# Patient Record
Sex: Female | Born: 1970 | Race: Black or African American | Hispanic: No | Marital: Single | Smoking: Never smoker
Health system: Southern US, Community
[De-identification: ages and names within clinical notes are randomized; demographics above are authoritative.]

## PROBLEM LIST (undated history)

## (undated) DIAGNOSIS — B009 Herpesviral infection, unspecified: Secondary | ICD-10-CM

## (undated) DIAGNOSIS — G459 Transient cerebral ischemic attack, unspecified: Secondary | ICD-10-CM

## (undated) DIAGNOSIS — D649 Anemia, unspecified: Secondary | ICD-10-CM

## (undated) HISTORY — PX: ABDOMINAL HYSTERECTOMY: SHX81

## (undated) HISTORY — DX: Transient cerebral ischemic attack, unspecified: G45.9

## (undated) HISTORY — DX: Herpesviral infection, unspecified: B00.9

---

## 2003-01-09 HISTORY — PX: MYOMECTOMY: SHX85

## 2007-10-12 ENCOUNTER — Emergency Department (HOSPITAL_COMMUNITY): Admission: EM | Admit: 2007-10-12 | Discharge: 2007-10-12 | Payer: Self-pay | Admitting: Emergency Medicine

## 2011-04-19 ENCOUNTER — Ambulatory Visit (INDEPENDENT_AMBULATORY_CARE_PROVIDER_SITE_OTHER): Payer: Self-pay | Admitting: Physician Assistant

## 2011-04-19 ENCOUNTER — Encounter: Payer: Self-pay | Admitting: Physician Assistant

## 2011-04-19 VITALS — BP 124/78 | HR 69 | Temp 98.5°F | Ht 62.0 in | Wt 146.0 lb

## 2011-04-19 DIAGNOSIS — N920 Excessive and frequent menstruation with regular cycle: Secondary | ICD-10-CM

## 2011-04-19 DIAGNOSIS — N938 Other specified abnormal uterine and vaginal bleeding: Secondary | ICD-10-CM | POA: Insufficient documentation

## 2011-04-19 NOTE — Progress Notes (Signed)
Spotted on 04/08/11 for 3 days then normal period 04/11/11 continued till today she is spotting. "She states that spotting after period usually lasts for 3-4days.

## 2011-04-19 NOTE — Progress Notes (Signed)
Chief Complaint:  Menstrual Problem   Mary Armstrong is  41 y.o. G3P0030.  Patient's last menstrual period was 04/08/2011..  She presents complaining of Menstrual Problem  Onset is described as ongoing and has been present for  6 months. Pt had myomectomy for fibroids with pain 6 years ago. Reports regular, normal periods until November of 2012 when they started to become heavier. No period in December, heavy in January   Obstetrical/Gynecological History: Pertinent Gynecological History: Menses: flow is excessive with use of 12 pads or tampons on heaviest days Bleeding: heavy Contraception: none Sexually transmitted diseases: HSV Previous GYN Procedures: myomectomy  Last mammogram:N/A Last pap: normal Date: 2012   Past Medical History: Past Medical History  Diagnosis Date  . Menorrhagia 04/19/2011    Past Surgical History: Past Surgical History  Procedure Date  . Myomectomy 2005    Family History: Family History  Problem Relation Age of Onset  . Hypertension Mother     Social History: History  Substance Use Topics  . Smoking status: Passive Smoker    Types: Cigarettes  . Smokeless tobacco: Never Used  . Alcohol Use: Yes     social use    Allergies: No Known Allergies  Review of Systems - Negative except what has been reviewed in the HPI  Physical Exam   Blood pressure 124/78, pulse 69, temperature 98.5 F (36.9 C), temperature source Oral, height 5\' 2"  (1.575 m), weight 146 lb (66.225 kg), last menstrual period 04/08/2011.  General: General appearance - alert, well appearing, and in no distress, oriented to person, place, and time and normal appearing weight Mental status - alert, oriented to person, place, and time, normal mood, behavior, speech, dress, motor activity, and thought processes, affect appropriate to mood Focused Gynecological Exam: VULVA: normal appearing vulva with no masses, tenderness or lesions, VAGINA: vaginal discharge - bloody and small  amount, CERVIX: normal appearing cervix without discharge or lesions, UTERUS: enlarged to 14 week's size, irregular, mobile, ADNEXA: normal adnexa in size, nontender and no masses  Assessment: 1. Menorrhagia  US Pelvis Complete, US Transvaginal Non-OB   Probable Fibroid uterus  Plan: Given pt desires for fertility, will have pt FU in 2-3 weeks for result and plan of care with MD.   Maylon Cos E. 04/19/2011,3:58 PM

## 2011-04-19 NOTE — Patient Instructions (Signed)
Menorrhagia Dysfunctional uterine bleeding is different from a normal menstrual period. When periods are heavy or there is more bleeding than is usual for you, it is called menorrhagia. It may be caused by hormonal imbalance, or physical, metabolic, or other problems. Examination is necessary in order that your caregiver may treat treatable causes. If this is a continuing problem, a D&C may be needed. That means that the cervix (the opening of the uterus or womb) is dilated (stretched larger) and the lining of the uterus is scraped out. The tissue scraped out is then examined under a microscope by a specialist (pathologist) to make sure there is nothing of concern that needs further or more extensive treatment. HOME CARE INSTRUCTIONS   If medications were prescribed, take exactly as directed. Do not change or switch medications without consulting your caregiver.   Long term heavy bleeding may result in iron deficiency. Your caregiver may have prescribed iron pills. They help replace the iron your body lost from heavy bleeding. Take exactly as directed. Iron may cause constipation. If this becomes a problem, increase the bran, fruits, and roughage in your diet.   Do not take aspirin or medicines that contain aspirin one week before or during your menstrual period. Aspirin may make the bleeding worse.   If you need to change your sanitary pad or tampon more than once every 2 hours, stay in bed and rest as much as possible until the bleeding stops.   Eat well-balanced meals. Eat foods high in iron. Examples are leafy green vegetables, meat, liver, eggs, and whole grain breads and cereals. Do not try to lose weight until the abnormal bleeding has stopped and your blood iron level is back to normal.  SEEK MEDICAL CARE IF:   You need to change your sanitary pad or tampon more than once an hour.   You develop nausea (feeling sick to your stomach) and vomiting, dizziness, or diarrhea while you are taking  your medicine.   You have any problems that may be related to the medicine you are taking.  SEEK IMMEDIATE MEDICAL CARE IF:   You have a fever.   You develop chills.   You develop severe bleeding or start to pass blood clots.   You feel dizzy or faint.  MAKE SURE YOU:   Understand these instructions.   Will watch your condition.   Will get help right away if you are not doing well or get worse.  Document Released: 12/25/2004 Document Revised: 12/14/2010 Document Reviewed: 08/15/2007 Exit[employer] Patient Information 2012 [employer].Fibroids You have been diagnosed as having a fibroid. Fibroids are smooth muscle lumps (tumors) which can occur any place in a woman's body. They are usually in the womb (uterus). The most common problem (symptom) of fibroids is bleeding. Over time this may cause low red blood cells (anemia). Other symptoms include feelings of pressure and pain in the pelvis. The diagnosis (learning what is wrong) of fibroids is made by physical exam. Sometimes tests such as an ultrasound are used. This is helpful when fibroids are felt around the ovaries and to look for tumors. TREATMENT   Most fibroids do not need surgical or medical treatment. Sometimes a tissue sample (biopsy) of the lining of the uterus is done to rule out cancer. If there is no cancer and only a small amount of bleeding, the problem can be watched.   Hormonal treatment can improve the problem.   When surgery is needed, it can consist of removing the fibroid. Vaginal   birth may not be possible after the removal of fibroids. This depends on where they are and the extent of surgery. When pregnancy occurs with fibroids it is usually normal.   Your caregiver can help decide which treatments are best for you.  HOME CARE INSTRUCTIONS   Do not use aspirin as this may increase bleeding problems.   If your periods (menses) are heavy, record the number of pads or tampons used per month. Bring this information  to your caregiver. This can help them determine the best treatment for you.  SEEK IMMEDIATE MEDICAL CARE IF:  You have pelvic pain or cramps not controlled with medications, or experience a sudden increase in pain.   You have an increase of pelvic bleeding between and during menses.   You feel lightheaded or have fainting spells.   You develop worsening belly (abdominal) pain.  Document Released: 12/23/1999 Document Revised: 12/14/2010 Document Reviewed: 08/14/2007 Exit[employer] Patient Information 2012 [employer]. 

## 2011-04-23 ENCOUNTER — Telehealth: Payer: Self-pay | Admitting: *Deleted

## 2011-04-23 NOTE — Telephone Encounter (Signed)
Returned call to pt (twice) and was told I had the wrong #. I verified that the # I dialed was [phone]. I then called her alternate contact: Andria Frames- (mother) and spoke with a gentleman who stated that neither Marylene Land or Alvira Philips were @ home. I asked him to have Twisha call us back. He agreed.   *Note: pt needs to be scheduled for pelvic and transvag US which was ordered on 4/11.

## 2011-04-23 NOTE — Telephone Encounter (Signed)
Patient states was seen on Friday and is still having same problem. Was told if having same problem to call back.

## 2011-04-24 NOTE — Telephone Encounter (Signed)
Called and spoke w/pt. She stated that she is still having vaginal bleeding and Rosalita Chessman had told her to call back.  She is using slender tampons and changing them approximately 2x per Severino Paolo.  I told pt that this may be abnormal for her but is not excessive and does not require anything else to be done immediately.  I advised her to go to [facility] for bleeding which is 1 or more pads/tampons/hr x3 consecutive hours.  I explained that she also needs [place] prior to her next clinic appt. I scheduled her [place] for 05/03/11 @ 1030.  Pt was satisfied with the plan of care and voiced understanding of all instructions and information.

## 2011-04-24 NOTE — Telephone Encounter (Signed)
Pt called front desk and gave correct ph # as [phone].

## 2011-05-03 ENCOUNTER — Ambulatory Visit (HOSPITAL_COMMUNITY)
Admission: RE | Admit: 2011-05-03 | Discharge: 2011-05-03 | Disposition: A | Discharge status: Home / Self Care | Payer: Self-pay | Source: Ambulatory Visit | Attending: Physician Assistant | Admitting: Physician Assistant

## 2011-05-03 DIAGNOSIS — D251 Intramural leiomyoma of uterus: Secondary | ICD-10-CM | POA: Insufficient documentation

## 2011-05-03 DIAGNOSIS — N920 Excessive and frequent menstruation with regular cycle: Secondary | ICD-10-CM | POA: Insufficient documentation

## 2011-05-11 ENCOUNTER — Encounter: Payer: Self-pay | Admitting: Obstetrics & Gynecology

## 2011-05-11 ENCOUNTER — Ambulatory Visit (INDEPENDENT_AMBULATORY_CARE_PROVIDER_SITE_OTHER): Payer: Self-pay | Admitting: Obstetrics & Gynecology

## 2011-05-11 ENCOUNTER — Other Ambulatory Visit (HOSPITAL_COMMUNITY)
Admission: RE | Admit: 2011-05-11 | Discharge: 2011-05-11 | Disposition: A | Discharge status: Home / Self Care | Payer: Self-pay | Source: Ambulatory Visit | Attending: Obstetrics & Gynecology | Admitting: Obstetrics & Gynecology

## 2011-05-11 VITALS — BP 125/81 | HR 66 | Temp 98.0°F | Ht 62.0 in | Wt 144.3 lb

## 2011-05-11 DIAGNOSIS — D259 Leiomyoma of uterus, unspecified: Secondary | ICD-10-CM

## 2011-05-11 DIAGNOSIS — D219 Benign neoplasm of connective and other soft tissue, unspecified: Secondary | ICD-10-CM | POA: Insufficient documentation

## 2011-05-11 DIAGNOSIS — N920 Excessive and frequent menstruation with regular cycle: Secondary | ICD-10-CM

## 2011-05-11 DIAGNOSIS — Z1231 Encounter for screening mammogram for malignant neoplasm of breast: Secondary | ICD-10-CM

## 2011-05-11 DIAGNOSIS — Z0000 Encounter for general adult medical examination without abnormal findings: Secondary | ICD-10-CM

## 2011-05-11 LAB — CBC
MCH: 31.6 pg (ref 26.0–34.0)
MCHC: 33.4 g/dL (ref 30.0–36.0)
Platelets: 333 10*3/uL (ref 150–400)

## 2011-05-11 LAB — POCT PREGNANCY, URINE: Preg Test, Ur: NEGATIVE

## 2011-05-11 LAB — TSH: TSH: 0.381 u[IU]/mL (ref 0.350–4.500)

## 2011-05-11 NOTE — Assessment & Plan Note (Signed)
A: recurrent symptomatic uterine fibroids.  P: Pap smear performed today. CBC and TSH checked. Mammogram ordered Patient refer to Reproductive Endocrinology Dr. Lajuana Carry, MD  F/u as needed based on results of pap smear, endometrial biopsy and desire for surgical treatment of fibroids (myomectomy vs. Uterine artery embolization).

## 2011-05-11 NOTE — Patient Instructions (Addendum)
Charliegh,  Thank you very much for coming in to see [place] today. Please take motrin as needed for pain. We will be in contact with the results of your pap smear and endometrial biopsy and labs.  Please have your mammogram done.  Pleas contact the Reproductive Endocrinologist: Dr. Lajuana Carry, MD  [address] [id number]  [phone]   Drs. Avaleigh Decuir and  [employer]

## 2011-05-11 NOTE — Progress Notes (Signed)
Patient ID: Mary Armstrong, female   DOB: 1970-05-24, 41 y.o.   MRN: [id number] Subjective:    Mary Armstrong is a 41 y.o. female who presents with uterine fibroids.  Since her last office visit she had uterine ultrasound that revealed multiple uterine firebirds, the largest of which has a submucosal component. Since the last visit she has also had persistent uterine bleeding. Flow was initially heavy but she is now spotting. Dysmenorrhea: none. Cyclic symptoms include bloating and intermittent R sided pelvic pain. . No intermenstrual bleeding, spotting, or discharge.  Of note, patient has never carried a fetus to term. She desires children. She has actively been trying to conceive for the last year without success.   Current contraception: none History of abnormal Pap smear: no Family history of uterine or ovarian cancer: no Regular self breast exam: no History of abnormal mammogram: no previous mammogram Family history of breast cancer: no History of abnormal lipids: no Menstrual History: OB History    Grav Para Term Preterm Abortions TAB SAB Ect Mult Living   4 0   4 1 3    0      Patient's last menstrual period was 04/08/2011.   Review of Systems Pertinent items are noted in HPI.    Objective:     BP 125/81  Pulse 66  Temp(Src) 98 F (36.7 C) (Oral)  Ht 5\' 2"  (1.575 m)  Wt 144 lb 4.8 oz (65.454 kg)  BMI 26.39 kg/m2  LMP 04/08/2011 General appearance: alert, cooperative and no distress Pelvic: cervix normal in appearance, external genitalia normal, no adnexal masses or tenderness, no cervical motion tenderness, rectovaginal septum normal and uterus enlarged. Dark brown blood in vagina.    Endometrial Biopsy Procedure Note  Pre-operative Diagnosis: uterine fibroids and abnormal uterine bleeding   Post-operative Diagnosis: same  Indications: abnormal uterine bleeding  Procedure Details   Urine pregnancy test was done and result was negative.  The risks (including  infection, bleeding, pain, and uterine perforation) and benefits of the procedure were explained to the patient and Verbal informed consent was obtained.  Antibiotic prophylaxis against endocarditis was not indicated.   The patient was placed in the dorsal lithotomy position.  A Graves' speculum inserted in the vagina, and the cervix prepped with povidone iodine.  Endocervical curettage with a Kevorkian curette was not performed.   A sharp tenaculum was applied to the anterior lip of the cervix for stabilization.  A sterile uterine sound was used to sound the uterus to a depth of 9.5cm.  A Pipelle endometrial aspirator was used to sample the endometrium.  Sample was sent for pathologic examination.  Condition: Stable  Complications: None  Plan:  The patient was advised to call for any fever or for prolonged or severe pain or bleeding. She was advised to use NSAID as needed for mild to moderate pain. She was advised to avoid vaginal intercourse for 48 hours or until the bleeding has completely stopped.  Attending Physician Documentation:  Dr. Nicholaus Bloom attending and present for assistance.  Assessment:    Symptomatic uterine fibroids.    Plan:  Pap smear performed today. CBC and TSH checked. Mammogram ordered Patient refer to Reproductive Endocrinology Dr. Lajuana Carry, MD  F/u as needed based on results of pap smear, endometrial biopsy and desire for surgical treatment of fibroids (myomectomy vs. Uterine artery embolization).

## 2011-05-25 ENCOUNTER — Ambulatory Visit: Payer: Self-pay | Admitting: Advanced Practice Midwife

## 2011-05-28 ENCOUNTER — Ambulatory Visit (HOSPITAL_COMMUNITY): Admission: RE | Admit: 2011-05-28 | Payer: Self-pay | Source: Ambulatory Visit

## 2011-05-29 ENCOUNTER — Other Ambulatory Visit: Payer: Self-pay | Admitting: Obstetrics & Gynecology

## 2011-05-29 DIAGNOSIS — Z1231 Encounter for screening mammogram for malignant neoplasm of breast: Secondary | ICD-10-CM

## 2011-06-07 ENCOUNTER — Encounter: Payer: Self-pay | Admitting: Obstetrics & Gynecology

## 2011-06-07 ENCOUNTER — Ambulatory Visit (INDEPENDENT_AMBULATORY_CARE_PROVIDER_SITE_OTHER): Payer: Self-pay | Admitting: Obstetrics & Gynecology

## 2011-06-07 VITALS — BP 122/87 | HR 72 | Temp 98.0°F | Ht 62.0 in | Wt 148.1 lb

## 2011-06-07 DIAGNOSIS — N949 Unspecified condition associated with female genital organs and menstrual cycle: Secondary | ICD-10-CM

## 2011-06-07 DIAGNOSIS — D259 Leiomyoma of uterus, unspecified: Secondary | ICD-10-CM

## 2011-06-07 DIAGNOSIS — N938 Other specified abnormal uterine and vaginal bleeding: Secondary | ICD-10-CM

## 2011-06-07 DIAGNOSIS — D219 Benign neoplasm of connective and other soft tissue, unspecified: Secondary | ICD-10-CM

## 2011-06-07 DIAGNOSIS — N979 Female infertility, unspecified: Secondary | ICD-10-CM

## 2011-06-07 NOTE — Patient Instructions (Signed)
Return to clinic for any scheduled appointments or for any gynecologic concerns as needed.   

## 2011-06-07 NOTE — Progress Notes (Signed)
History:  41 y.o. G4P0040 here today for discussion about management of DUB and fibroids.  She had a negative endometrial biopsy and ultrasound showing fibroids, see report below.  She reports daily spotting, no heavy bleeding.  Spotting is  "annoying". No other associated symptoms.  Patient is interested in management strategy that will not hinder fertility; she wants to try to get pregnant this year.  Also wants to be referred to an infertility specialist.  The following portions of the patient's history were reviewed and updated as appropriate: allergies, current medications, past family history, past medical history, past social history, past surgical history and problem list.  Review of Systems:  Pertinent items are noted in HPI.  Objective:  Physical Exam Blood pressure 122/87, pulse 72, temperature 98 F (36.7 C), temperature source Oral, height 5\' 2"  (1.575 m), weight 148 lb 1.6 oz (67.178 kg). [facility]  Labs and Imaging POCT PREGNANCY, URINE   Collection Time   05/11/11  9:22 AM      Component Value Range   Preg Test, Ur NEGATIVE  NEGATIVE   TSH   Collection Time   05/11/11  9:36 AM      Component Value Range   TSH 0.381  0.350 - 4.500 (uIU/mL)  CBC   Collection Time   05/11/11  9:36 AM      Component Value Range   WBC 5.2  4.0 - 10.5 (K/uL)   RBC 3.99  3.87 - 5.11 (MIL/uL)   Hemoglobin 12.6  12.0 - 15.0 (g/dL)   HCT 16.1  09.6 - 04.5 (%)   MCV 94.5  78.0 - 100.0 (fL)   MCH 31.6  26.0 - 34.0 (pg)   MCHC 33.4  30.0 - 36.0 (g/dL)   RDW 40.9  81.1 - 91.4 (%)   Platelets 333  150 - 400 (K/uL)   05/11/11 Pap normal   Endometrium, biopsy: INACTIVE ENDOMETRIUM, ABUNDANT BLOOD AND SCANT BENIGN ENDOCERVIX. NO HYPERPLASIA OR CARCINOMA. 04/19/11 TRANSABDOMINAL AND TRANSVAGINAL ULTRASOUND OF PELVIS  Findings: Uterus: Is anteverted and retroflexed and demonstrates a sagittal length of 11.1 cm, depth of 6.9 cm and width of 6.7 cm. Multiple fibroids are identified with the largest located  in the posterior left upper uterine segment measuring 4.5 x 5.2 x 4.3 cm. This deviates the endometrial canal towards the right suggesting a small submucosal component. A second fibroid is mural in the left lateral mid body measuring 1.5 x 1.6 x 1.9 cm. Endometrium: Is homogeneously echogenic with an AP width of 8 mm.  No areas of focal thickening or heterogeneity are seen. The appearance correlates with a presecretory endometrial stripe and corresponds with the provided LMP of 04/09/2011. Right ovary: Has a normal appearance measuring 3.0 x 1.7 x 1.7 cm  Left ovary: Has a normal appearance measuring 3.2 x 3.1 x 2.5 cm Other findings: A small amount of simple free fluid is noted adjacent to the right ovary. No separate adnexal masses are seen.  IMPRESSION: Fibroid uterus with fibroid sizes and locations as noted above. The largest fibroid appears to have a small submucosal component. No focal endometrial abnormality noted. Normal ovaries.    Assessment & Plan:  Discussed management options for abnormal uterine bleeding including oral Provera, Depo Provera, Mirena IUD, D&C, hysteroscopic myomectomy (even though only small submucosal component seen on ultrasound), endometrial ablation (Novasure/Hydrothermal Ablation/Thermachoice) or hysterectomy as definitive surgical management.  Discussed risks and benefits of each method.  Patient does not want any of these modalities; wants to talk to  an infertility specialist first.  She was referred to [facility] in [facility] for further discussion and management.  Total visit time was about 25 minutes.

## 2011-06-19 ENCOUNTER — Ambulatory Visit (HOSPITAL_COMMUNITY): Payer: Self-pay | Attending: Obstetrics & Gynecology

## 2011-07-05 ENCOUNTER — Ambulatory Visit: Payer: Self-pay | Admitting: Obstetrics & Gynecology

## 2012-08-08 ENCOUNTER — Emergency Department (HOSPITAL_BASED_OUTPATIENT_CLINIC_OR_DEPARTMENT_OTHER)
Admission: EM | Admit: 2012-08-08 | Discharge: 2012-08-08 | Disposition: A | Discharge status: Home / Self Care | Payer: BC Managed Care – PPO | Attending: Emergency Medicine | Admitting: Emergency Medicine

## 2012-08-08 ENCOUNTER — Encounter (HOSPITAL_BASED_OUTPATIENT_CLINIC_OR_DEPARTMENT_OTHER): Payer: Self-pay | Admitting: *Deleted

## 2012-08-08 DIAGNOSIS — Z87828 Personal history of other (healed) physical injury and trauma: Secondary | ICD-10-CM | POA: Insufficient documentation

## 2012-08-08 DIAGNOSIS — Z8742 Personal history of other diseases of the female genital tract: Secondary | ICD-10-CM | POA: Insufficient documentation

## 2012-08-08 DIAGNOSIS — Y9389 Activity, other specified: Secondary | ICD-10-CM | POA: Insufficient documentation

## 2012-08-08 DIAGNOSIS — W2209XA Striking against other stationary object, initial encounter: Secondary | ICD-10-CM | POA: Insufficient documentation

## 2012-08-08 DIAGNOSIS — Y990 Civilian activity done for income or pay: Secondary | ICD-10-CM | POA: Insufficient documentation

## 2012-08-08 DIAGNOSIS — M79601 Pain in right arm: Secondary | ICD-10-CM

## 2012-08-08 DIAGNOSIS — S59909A Unspecified injury of unspecified elbow, initial encounter: Secondary | ICD-10-CM | POA: Insufficient documentation

## 2012-08-08 DIAGNOSIS — M7521 Bicipital tendinitis, right shoulder: Secondary | ICD-10-CM | POA: Diagnosis present

## 2012-08-08 DIAGNOSIS — S6990XA Unspecified injury of unspecified wrist, hand and finger(s), initial encounter: Secondary | ICD-10-CM | POA: Insufficient documentation

## 2012-08-08 DIAGNOSIS — Y9289 Other specified places as the place of occurrence of the external cause: Secondary | ICD-10-CM | POA: Insufficient documentation

## 2012-08-08 DIAGNOSIS — M7520 Bicipital tendinitis, unspecified shoulder: Secondary | ICD-10-CM | POA: Insufficient documentation

## 2012-08-08 MED ORDER — OXYCODONE-ACETAMINOPHEN 5-325 MG PO TABS: 2.0000 | ORAL_TABLET | Freq: Four times a day (QID) | ORAL | Status: DC | PRN

## 2012-08-08 NOTE — ED Provider Notes (Signed)
CSN: [id number]     Arrival date & time 08/08/12  1413 History     First MD Initiated Contact with Patient 08/08/12 1504     Chief Complaint  Patient presents with  . Arm Pain   (Consider location/radiation/quality/duration/timing/severity/associated sxs/prior Treatment) Patient is a 42 y.o. female presenting with arm pain. The history is provided by the patient.  Arm Pain This is a new problem. Episode onset: 1 week ago. The problem occurs constantly. The problem has not changed since onset.Pertinent negatives include no chest pain, no abdominal pain, no headaches and no shortness of breath. Exacerbated by: flexion/extension of right arm. Nothing relieves the symptoms. She has tried acetaminophen for the symptoms. The treatment provided mild relief.    Past Medical History  Diagnosis Date  . Menorrhagia 04/19/2011   Past Surgical History  Procedure Laterality Date  . Myomectomy  2005   Family History  Problem Relation Age of Onset  . Hypertension Mother   . Thyroid disease Mother    History  Substance Use Topics  . Smoking status: Passive Smoke Exposure - Never Smoker    Types: Cigarettes  . Smokeless tobacco: Never Used  . Alcohol Use: Yes     Comment: social use   OB History   Grav Para Term Preterm Abortions TAB SAB Ect Mult Living   4 0   4 1 3    0     Review of Systems  Constitutional: Negative for fever and fatigue.  HENT: Negative for congestion, drooling and neck pain.   Eyes: Negative for pain.  Respiratory: Negative for cough and shortness of breath.   Cardiovascular: Negative for chest pain.  Gastrointestinal: Negative for nausea, vomiting, abdominal pain and diarrhea.  Genitourinary: Negative for dysuria and hematuria.  Musculoskeletal: Negative for back pain and gait problem.  Skin: Negative for color change.  Neurological: Negative for dizziness and headaches.  Hematological: Negative for adenopathy.  Psychiatric/Behavioral: Negative for behavioral  problems.  All other systems reviewed and are negative.    Allergies  Review of patient's allergies indicates no known allergies.  Home Medications   Current Outpatient Rx  Name  Route  Sig  Dispense  Refill  . Lysine 1000 MG TABS   Oral   Take by mouth.         . oxyCODONE-acetaminophen (PERCOCET) 5-325 MG per tablet   Oral   Take 2 tablets by mouth every 6 (six) hours as needed for pain.   15 tablet   0    BP 135/98  Pulse 55  Temp(Src) 98.3 F (36.8 C) (Oral)  Resp 20  Wt 132 lb (59.875 kg)  BMI 24.14 kg/m2  SpO2 100% Physical Exam  Nursing note and vitals reviewed. Constitutional: She is oriented to person, place, and time. She appears well-developed and well-nourished.  HENT:  Head: Normocephalic.  Mouth/Throat: No oropharyngeal exudate.  Eyes: Conjunctivae and EOM are normal. Pupils are equal, round, and reactive to light.  Neck: Normal range of motion. Neck supple.  Cardiovascular: Normal rate, regular rhythm, normal heart sounds and intact distal pulses.  Exam reveals no gallop and no friction rub.   No murmur heard. Pulmonary/Chest: Effort normal and breath sounds normal. No respiratory distress. She has no wheezes.  Abdominal: Soft. Bowel sounds are normal. There is no tenderness. There is no rebound and no guarding.  Musculoskeletal: Normal range of motion. She exhibits no edema and no tenderness.  Normal rom of RUE. 2+ pulses in bilateral UE's. Normal sensation throughout.  Reproduction of pain w/ active flex/ext of elbow, but not w/ passive. Mild ttp of right distal bicep insertion and right proximal anterior surface of forearm.   Neurological: She is alert and oriented to person, place, and time.  Skin: Skin is warm and dry.  Psychiatric: She has a normal mood and affect. Her behavior is normal.    ED Course   Procedures (including critical care time)  Labs Reviewed - No data to display No results found. 1. Right arm pain   2. Biceps tendinitis  on right     MDM  4:00 PM 42 y.o. female pw right arm pain that began approx 1 week ago. Pt hit right elbow while working at that time, notes proximal forearm and distal bicep pain w/ movement since. Neurovasc intact. Ddx includes contusion, bicipital tendonitis, ulnar nerve contusion. Pt notes nsaids only provided mild relief. Will give short course of percocet and recommend several days of rest as pt works at [employer] w/ repeated use of her arms/hands.  4:00 PM:  I have discussed the diagnosis/risks/treatment options with the patient and believe the pt to be eligible for discharge home to follow-up and establish with a pcp if pain continues. We also discussed returning to the ED immediately if new or worsening sx occur. We discussed the sx which are most concerning (e.g., worsening pain, numbness, weakness) that necessitate immediate return. Any new prescriptions provided to the patient are listed below.  Discharge Medication List as of 08/08/2012  3:39 PM    START taking these medications   Details  oxyCODONE-acetaminophen (PERCOCET) 5-325 MG per tablet Take 2 tablets by mouth every 6 (six) hours as needed for pain., Starting 08/08/2012, Until Discontinued, Print          Junius Argyle, MD 08/08/12 1600

## 2012-08-08 NOTE — ED Notes (Signed)
3 weeks ago she had a mash injury to her right thumb. Right elbow injury hit against metal surface last week. Woke this am with numbness and sharp shooting pain in her right arm. Hard to grip objects.

## 2012-08-11 ENCOUNTER — Encounter (HOSPITAL_BASED_OUTPATIENT_CLINIC_OR_DEPARTMENT_OTHER): Payer: Self-pay | Admitting: Family Medicine

## 2012-08-11 ENCOUNTER — Emergency Department (HOSPITAL_BASED_OUTPATIENT_CLINIC_OR_DEPARTMENT_OTHER)
Admission: EM | Admit: 2012-08-11 | Discharge: 2012-08-11 | Disposition: A | Discharge status: Home / Self Care | Payer: BC Managed Care – PPO | Attending: Emergency Medicine | Admitting: Emergency Medicine

## 2012-08-11 DIAGNOSIS — M79609 Pain in unspecified limb: Secondary | ICD-10-CM | POA: Insufficient documentation

## 2012-08-11 DIAGNOSIS — M778 Other enthesopathies, not elsewhere classified: Secondary | ICD-10-CM

## 2012-08-11 DIAGNOSIS — M79601 Pain in right arm: Secondary | ICD-10-CM

## 2012-08-11 DIAGNOSIS — M6580 Other synovitis and tenosynovitis, unspecified site: Secondary | ICD-10-CM | POA: Insufficient documentation

## 2012-08-11 DIAGNOSIS — Z8742 Personal history of other diseases of the female genital tract: Secondary | ICD-10-CM | POA: Insufficient documentation

## 2012-08-11 MED ORDER — PREDNISONE 10 MG PO TABS: 20.0000 mg | ORAL_TABLET | Freq: Two times a day (BID) | ORAL | Status: DC

## 2012-08-11 NOTE — ED Provider Notes (Signed)
  CSN: [id number]     Arrival date & time 08/11/12  1018 History     First MD Initiated Contact with Patient 08/11/12 1019     Chief Complaint  Patient presents with  . Arm Pain   (Consider location/radiation/quality/duration/timing/severity/associated sxs/prior Treatment) HPI Comments: Patient presents for second time in three days for evaluation of right arm pain.  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  She was given percocet and is not improving.  She denies weakness, numbness.    Patient is a 42 y.o. female presenting with arm pain. The history is provided by the patient.  Arm Pain This is a new problem. Episode onset: 3 days ago. The problem occurs constantly. The problem has not changed since onset.Exacerbated by: movement, palpation. Nothing relieves the symptoms. She has tried nothing for the symptoms. The treatment provided no relief.    Past Medical History  Diagnosis Date  . Menorrhagia 04/19/2011   Past Surgical History  Procedure Laterality Date  . Myomectomy  2005   Family History  Problem Relation Age of Onset  . Hypertension Mother   . Thyroid disease Mother    History  Substance Use Topics  . Smoking status: Passive Smoke Exposure - Never Smoker    Types: Cigarettes  . Smokeless tobacco: Never Used  . Alcohol Use: Yes     Comment: social use   OB History   Grav Para Term Preterm Abortions TAB SAB Ect Mult Living   4 0   4 1 3    0     Review of Systems  All other systems reviewed and are negative.    Allergies  Review of patient's allergies indicates no known allergies.  Home Medications   Current Outpatient Rx  Name  Route  Sig  Dispense  Refill  . Lysine 1000 MG TABS   Oral   Take by mouth.         . oxyCODONE-acetaminophen (PERCOCET) 5-325 MG per tablet   Oral   Take 2 tablets by mouth every 6 (six) hours as needed for pain.   15 tablet   0    BP 104/61  Pulse 80  Temp(Src) 98.3 F (36.8  C) (Oral)  Resp 18  Ht 5\' 2"  (1.575 m)  Wt 132 lb (59.875 kg)  BMI 24.14 kg/m2  SpO2 100% Physical Exam  Nursing note and vitals reviewed. Constitutional: She is oriented to person, place, and time. She appears well-developed and well-nourished.  HENT:  Head: Normocephalic and atraumatic.  Neck: Normal range of motion. Neck supple.  Musculoskeletal:  The right elbow appears grossly normal.  There is no deformity.  The distal pulses, motor and sensation are all intact.    Neurological: She is alert and oriented to person, place, and time.  Skin: Skin is warm and dry.    ED Course   Procedures (including critical care time)  Labs Reviewed - No data to display No results found. No diagnosis found.  MDM  I suspect this is tendonitis of the elbow.  Will sling, treat with prednisone, follow up with pcp prn if not improving.  Geoffery Lyons, MD 08/11/12 1037

## 2012-08-12 ENCOUNTER — Other Ambulatory Visit (HOSPITAL_BASED_OUTPATIENT_CLINIC_OR_DEPARTMENT_OTHER): Payer: Self-pay | Admitting: Internal Medicine

## 2012-08-12 ENCOUNTER — Ambulatory Visit (HOSPITAL_BASED_OUTPATIENT_CLINIC_OR_DEPARTMENT_OTHER)
Admission: RE | Admit: 2012-08-12 | Discharge: 2012-08-12 | Disposition: A | Discharge status: Home / Self Care | Payer: BC Managed Care – PPO | Source: Ambulatory Visit | Attending: Internal Medicine | Admitting: Internal Medicine

## 2012-08-12 DIAGNOSIS — M25521 Pain in right elbow: Secondary | ICD-10-CM

## 2012-08-12 DIAGNOSIS — M25539 Pain in unspecified wrist: Secondary | ICD-10-CM | POA: Insufficient documentation

## 2012-08-28 ENCOUNTER — Encounter: Payer: Self-pay | Admitting: Internal Medicine

## 2012-08-28 ENCOUNTER — Ambulatory Visit (INDEPENDENT_AMBULATORY_CARE_PROVIDER_SITE_OTHER): Payer: BC Managed Care – PPO | Admitting: Internal Medicine

## 2012-08-28 VITALS — BP 98/60 | HR 78 | Temp 98.5°F | Resp 18 | Wt 134.0 lb

## 2012-08-28 DIAGNOSIS — A6000 Herpesviral infection of urogenital system, unspecified: Secondary | ICD-10-CM

## 2012-08-28 DIAGNOSIS — M79601 Pain in right arm: Secondary | ICD-10-CM

## 2012-08-28 DIAGNOSIS — R209 Unspecified disturbances of skin sensation: Secondary | ICD-10-CM

## 2012-08-28 DIAGNOSIS — M79609 Pain in unspecified limb: Secondary | ICD-10-CM

## 2012-08-28 DIAGNOSIS — N920 Excessive and frequent menstruation with regular cycle: Secondary | ICD-10-CM

## 2012-08-28 DIAGNOSIS — R208 Other disturbances of skin sensation: Secondary | ICD-10-CM

## 2012-08-28 DIAGNOSIS — Z139 Encounter for screening, unspecified: Secondary | ICD-10-CM

## 2012-08-28 NOTE — Progress Notes (Addendum)
Subjective:    Patient ID: Mary Armstrong, female    DOB: August 31, 1970, 42 y.o.   MRN: [id number]  HPI  New pt here for first visit.  Mary Armstrong had been without insurance for a while but now is working for [employer]  and has insurance.    Former care Dr. Della Armstrong.    PMH of DUB  , genital herpes,  Uterine fibroids.  She has FH of Breast cancer in [place] and M Great Aunt.  Pt has not had her initial mammogram  She is concerned over intermittant  Burning sensation and pain in her R arm.    She works as a Location manager for [employer]. She describes she smashed her R thumb one month ago and she hit her R elbow against a piece of metal 2 weeks ago.  She has been evaluated by the nurse at work and she has been to the [facility] ER on two occasions for this issue.  She also has seen Dr. Della Armstrong who sent her for a plain film of her R elbow showing no Fx, dislocation or effusion.    Pain is not constant , will usually occur at work  Described as a "burning sensation " down her R arm.  She denies any neck injury or pain.  She does lift heavy things at times but could not tell me how much it weighted.   Pt seems most concerned if there is nerve damage with the burning sensation and she sometimes cannot lift things with her R arm due to symptoms mentioned above  She has DUB still bleeds heavily at times, and would [facility] management options now that she has insurance.  See prior GYN notes  No Known Allergies Past Medical History  Diagnosis Date  . Menorrhagia 04/19/2011  . Herpes    Past Surgical History  Procedure Laterality Date  . Myomectomy  2005   History   Social History  . Marital Status: Single    Spouse Name: N/A    Number of Children: N/A  . Years of Education: N/A   Occupational History  . Not on file.   Social History Main Topics  . Smoking status: Never Smoker   . Smokeless tobacco: Never Used  . Alcohol Use: Yes   Comment: social use  . Drug Use: No     Comment: social-  once/twice month  . Sexual Activity: Yes    Partners: Male    Birth Control/ Protection: None   Other Topics Concern  . Not on file   Social History Narrative  . No narrative on file   Family History  Problem Relation Age of Onset  . Hypertension Mother   . Thyroid disease Mother   . COPD Mother   . Cancer Maternal Aunt     breast  . Cancer Maternal Grandmother     breast  . Heart disease Maternal Grandmother    Patient Active Problem List   Diagnosis Date Noted  . Right arm pain 08/08/2012  . Biceps tendinitis on right 08/08/2012  . Fibroids 05/11/2011  . DUB (dysfunctional uterine bleeding) 04/19/2011   Current Outpatient Prescriptions on File Prior to Visit  Medication Sig Dispense Refill  . Lysine 1000 MG TABS Take by mouth.       No current facility-administered medications on file prior to visit.     Review of Systems    see HPI  Objective:   Physical Exam  Physical Exam  Nursing note and vitals reviewed.  Constitutional: She is oriented to person, place, and time. She appears well-developed and well-nourished.  HENT:  Head: Normocephalic and atraumatic.  Neckno pain or paresthesias to ROM   Lhermitte sign negative Cardiovascular: Normal rate and regular rhythm. Exam reveals no gallop and no friction rub.  No murmur heard.  Pulmonary/Chest: Breath sounds normal. She has no wheezes. She has no rales.  Neurological: She is alert and oriented to person, place, and time. CN II_XII  intact  Reflexes  2+  Symmetric  UE and LE Motor 5/5 finger abduction,  Wrist F/E, elbow F/E , shoulder F/E bilaterally Sensation intact to microfilatment bilateral UE/LE Skin: Skin is warm and dry.  Psychiatric: She has a normal mood and affect. Her behavior is normal.      Assessment & Plan:  Dysethesia, intermittant pain  R arm.  May be overuse syndrome with local neuropathy R arm.  Tendinitis may also be  overlying issue.   Will start with evaluation by sports medicine and refer to neurology for possible  EMB/NCV testing.  May use OTC med of choice  DUB/Menorrhagia/ Uterine fibroids:  ADvised pt to call [facility] for follow up.  Number given to pt  Check CBC , TSH with chenmistries today  Genital herpes  Valtrex prn  Will set up for mammogram   Will follow up in 3-4 weeks with me  Addendum  10/29  Informed by [facility] that pt still unable to follow up for diagnostic imaging that was recommended by their radiologist.  Certified letter sent to pt today and [facility] will contact Mary Armstrong to see if any financial assistance available for pt.

## 2012-08-29 ENCOUNTER — Ambulatory Visit (HOSPITAL_BASED_OUTPATIENT_CLINIC_OR_DEPARTMENT_OTHER)
Admission: RE | Admit: 2012-08-29 | Discharge: 2012-08-29 | Disposition: A | Discharge status: Home / Self Care | Payer: BC Managed Care – PPO | Source: Ambulatory Visit | Attending: Internal Medicine | Admitting: Internal Medicine

## 2012-08-29 DIAGNOSIS — Z139 Encounter for screening, unspecified: Secondary | ICD-10-CM

## 2012-08-29 DIAGNOSIS — Z1231 Encounter for screening mammogram for malignant neoplasm of breast: Secondary | ICD-10-CM | POA: Insufficient documentation

## 2012-08-29 LAB — CBC WITH DIFFERENTIAL/PLATELET
Basophils Relative: 1 % (ref 0–1)
Eosinophils Absolute: 0.1 10*3/uL (ref 0.0–0.7)
Eosinophils Relative: 3 % (ref 0–5)
Lymphs Abs: 1.5 10*3/uL (ref 0.7–4.0)
MCH: 31.5 pg (ref 26.0–34.0)
MCHC: 33 g/dL (ref 30.0–36.0)
MCV: 95.5 fL (ref 78.0–100.0)
Neutrophils Relative %: 55 % (ref 43–77)
Platelets: 346 10*3/uL (ref 150–400)
RDW: 14.9 % (ref 11.5–15.5)

## 2012-08-29 LAB — COMPREHENSIVE METABOLIC PANEL
ALT: 10 U/L (ref 0–35)
Alkaline Phosphatase: 47 U/L (ref 39–117)
CO2: 26 mEq/L (ref 19–32)
Creat: 0.78 mg/dL (ref 0.50–1.10)
Total Bilirubin: 0.3 mg/dL (ref 0.3–1.2)

## 2012-08-29 LAB — LIPID PANEL
Cholesterol: 139 mg/dL (ref 0–200)
LDL Cholesterol: 76 mg/dL (ref 0–99)
Total CHOL/HDL Ratio: 2.4 Ratio
Triglycerides: 31 mg/dL (ref <150)
VLDL: 6 mg/dL (ref 0–40)

## 2012-08-29 LAB — VITAMIN D 25 HYDROXY (VIT D DEFICIENCY, FRACTURES): Vit D, 25-Hydroxy: 17 ng/mL — ABNORMAL LOW (ref 30–89)

## 2012-08-30 ENCOUNTER — Other Ambulatory Visit: Payer: Self-pay | Admitting: Internal Medicine

## 2012-08-30 DIAGNOSIS — D649 Anemia, unspecified: Secondary | ICD-10-CM | POA: Insufficient documentation

## 2012-08-30 MED ORDER — INTEGRA 62.5-62.5-40-3 MG PO CAPS: 1.0000 | ORAL_CAPSULE | Freq: Every day | ORAL | Status: DC

## 2012-09-01 ENCOUNTER — Encounter: Payer: Self-pay | Admitting: *Deleted

## 2012-09-01 ENCOUNTER — Telehealth: Payer: Self-pay | Admitting: *Deleted

## 2012-09-01 NOTE — Telephone Encounter (Signed)
Notified pt of low iron instructed pt to pick up rx at [facility]

## 2012-09-02 ENCOUNTER — Other Ambulatory Visit: Payer: Self-pay | Admitting: Internal Medicine

## 2012-09-02 ENCOUNTER — Ambulatory Visit: Payer: BC Managed Care – PPO | Admitting: Family Medicine

## 2012-09-02 DIAGNOSIS — R928 Other abnormal and inconclusive findings on diagnostic imaging of breast: Secondary | ICD-10-CM

## 2012-09-10 ENCOUNTER — Ambulatory Visit (INDEPENDENT_AMBULATORY_CARE_PROVIDER_SITE_OTHER): Payer: BC Managed Care – PPO | Admitting: Neurology

## 2012-09-10 ENCOUNTER — Telehealth: Payer: Self-pay | Admitting: *Deleted

## 2012-09-10 ENCOUNTER — Encounter: Payer: Self-pay | Admitting: Neurology

## 2012-09-10 VITALS — BP 120/68 | HR 72 | Temp 98.3°F | Resp 20 | Ht 62.0 in | Wt 134.0 lb

## 2012-09-10 DIAGNOSIS — M25521 Pain in right elbow: Secondary | ICD-10-CM

## 2012-09-10 DIAGNOSIS — M25539 Pain in unspecified wrist: Secondary | ICD-10-CM

## 2012-09-10 DIAGNOSIS — R209 Unspecified disturbances of skin sensation: Secondary | ICD-10-CM

## 2012-09-10 DIAGNOSIS — M25529 Pain in unspecified elbow: Secondary | ICD-10-CM

## 2012-09-10 NOTE — Telephone Encounter (Signed)
Mary Armstrong called after seeing Dr Allena Katz today.  She said that Dr Allena Katz said that she needs to be referred to an ortho for her wrist; so she was calling today to see if she can get a referral.  See Dr Eliane Decree office note for his recommendations. Also please call De and let her know what is next.

## 2012-09-10 NOTE — Patient Instructions (Addendum)
We will schedule you for the EMG within the next week or so.

## 2012-09-10 NOTE — Progress Notes (Signed)
Neurology Clinic Note - Initial Visit   Date: September 10, 2012  Mary Armstrong MRN: [id number] DOB: 09-13-1970  Dear Dr Constance Goltz:  Thank you for your kind referral of Mary Armstrong for consultation of right arm pain. Although her history is well known to you, please allow [place] to reiterate it for the purpose of our medical record. The patient was accompanied to the clinic by no one.   History of Present Illness: Mary Armstrong is a 42 y.o. right-handed African American female presenting for evaluation of right arm and left wrist pain.  She currently works at [employer] as a Location manager and around the end of July, she slammed her thumb between the metal and steel while assembling hinges. She had throbbing pain from her thumb to right wrist which then became swollen over the next day.  A bruise formed under her nailbed which took a month to resolve.  In mid-August, she hit her elbow on metal after which she had severe burning pain over the elbow and lateral arm.  Pain is described as a burning sensation and exacerbated by over using the arm and alleviated by rest/ibuprofen.  Pain is ranked at 7/10 and is now intermittent.  She has associated intermittent numbness over the entire hand and elbow and, on one occasion, recalls waking up with an entirely numb hand.  Denies any tingling.  Sometimes she has weakness of her right hand, described as problems opening bottles.  Overall, the symptoms have improved since onset.  She had tried some hand exercises she looked up online.    She has seen Dr. Lovell Sheehan initially who ordered x-rays of her elbow which did not reveal any fracture and then established care with Dr. Constance Goltz who referred her to our office.   Regarding her left wrist, she broke her wrist about 20 years ago and feels that sometimes it feels like it is going to pop out. She hears popping and cracking noises sometimes when her hand gets in certain positions, such as rotating  the wrist in elbow extension.  Denies any weakness, numbness, or tingling.    Past Medical History  Diagnosis Date  . Menorrhagia 04/19/2011  . Genital herpes     Past Surgical History  Procedure Laterality Date  . Myomectomy  2005     Medications:  Current Outpatient Prescriptions on File Prior to Visit  Medication Sig Dispense Refill  . valACYclovir (VALTREX) 500 MG tablet Take 500 mg by mouth 2 (two) times daily.      . Fe Fum-FePoly-Vit C-Vit B3 (INTEGRA) 62.5-62.5-40-3 MG CAPS Take 1 capsule by mouth daily.  30 capsule  3  . Lysine 1000 MG TABS Take by mouth.       No current facility-administered medications on file prior to visit.    Allergies: No Known Allergies  Family History: Family History  Problem Relation Age of Onset  . Hypertension Mother     Living, 50  . Thyroid disease Mother   . COPD Mother   . Healthy Brother     Living, 34  . Breast cancer Maternal Grandmother     Died, 35  . Heart disease Maternal Grandmother     Social History: History   Social History  . Marital Status: Single    Spouse Name: N/A    Number of Children: N/A  . Years of Education: N/A   Occupational History  . Not on file.   Social History Main Topics  . Smoking status: Never  Smoker   . Smokeless tobacco: Never Used  . Alcohol Use: Yes     Comment: social use  . Drug Use: No     Comment: social-  once/twice month  . Sexual Activity: Yes    Partners: Male    Birth Control/ Protection: None   Other Topics Concern  . Not on file   Social History Narrative   Builds school buses, still working.    Review of Systems:  CONSTITUTIONAL: No fevers, chills, night sweats, or weight loss.   EYES: No visual changes or eye pain ENT: No hearing changes.  No history of nose bleeds.   RESPIRATORY: No cough, wheezing and shortness of breath.   CARDIOVASCULAR: Negative for chest pain, and palpitations.   GI: Negative for abdominal discomfort, blood in stools or black  stools.  No recent change in bowel habits.   GU:  No history of incontinence.   MUSCLOSKELETAL:+ joint pain.  No myalgias.   SKIN: Negative for lesions, rash, and itching.   HEMATOLOGY/ONCOLOGY: Negative for prolonged bleeding, bruising easily, and swollen nodes.  No history of cancer.   ENDOCRINE: Negative for cold or heat intolerance, polydipsia or goiter.   PSYCH:  No depression or anxiety symptoms.   NEURO: As Above.   Vital Signs:  BP 120/68  Pulse 72  Temp(Src) 98.3 F (36.8 C) (Oral)  Resp 20  Ht 5\' 2"  (1.575 m)  Wt 134 lb (60.782 kg)  BMI 24.5 kg/m2  LMP 08/08/2012 Pain Scale: 1 on a scale of 0-10   General Medical Exam: General:  Well appearing, comfortable.   Eyes/ENT: see cranial nerve examination.   Neck: No masses appreciated.  Full range of motion without tenderness. Respiratory:  Clear to auscultation, good air entry bilaterally.   Cardiac:  Regular rate and rhythm, no murmur.   GI:  Soft, non-tender, non-distended abdomen.  Bowel sounds normal. No masses, organomegaly.   Back:  No pain to palpation of spinous processes.   Extremities:  No deformities, edema, or skin discoloration. Good capillary refill.   Skin:  Skin color, texture, turgor normal. No rashes or lesions.  Neurological Exam: MENTAL STATUS including orientation to time, place, person, recent and remote memory, attention span and concentration, language, and fund of knowledge is normal.  Speech is not dysarthric.  CRANIAL NERVES: II:  No visual field defects.  Unremarkable fundi.   III-IV-VI: Pupils equal round and reactive to light.  Normal conjugate, extra-ocular eye movements in all directions of gaze.  No nystagmus.  No ptosis prior to or post sustained upgaze.   V:  Normal facial sensation.   VII:  Normal facial symmetry and movements. VIII:  Normal hearing and vestibular function.   IX-X:  Normal palatal movement.   XI:  Normal shoulder shrug and head rotation.   XII:  Normal tongue  strength and range of motion, no deviation or fasciculation.  MOTOR:  No atrophy, fasciculations or abnormal movements.  No pronator drift.  Tone is normal.  There is point tenderness over the lateral right elbow, near radial head.  Right Upper Extremity:    Left Upper Extremity:    Deltoid  5/5   Deltoid  5/5   Biceps  5/5   Biceps  5/5   Triceps  5/5   Triceps  5/5   Wrist extensors  5/5   Wrist extensors  5/5   Wrist flexors  5/5   Wrist flexors  5/5   Finger extensors  5/5   Finger  extensors  5/5   Finger flexors  5/5   Finger flexors  5/5   Dorsal interossei  5/5   Dorsal interossei  5/5   Abductor pollicis  5/5   Abductor pollicis  5/5   Tone (Ashworth scale)  0  Tone (Ashworth scale)  0   Right Lower Extremity:    Left Lower Extremity:    Hip flexors  5/5   Hip flexors  5/5   Hip extensors  5/5   Hip extensors  5/5   Knee flexors  5/5   Knee flexors  5/5   Knee extensors  5/5   Knee extensors  5/5   Dorsiflexors  5/5   Dorsiflexors  5/5   Plantarflexors  5/5   Plantarflexors  5/5   Toe extensors  5/5   Toe extensors  5/5   Toe flexors  5/5   Toe flexors  5/5   Tone (Ashworth scale)  0  Tone (Ashworth scale)  0   MSRs:  Right                                                                 Left brachioradialis 2+  brachioradialis 2+  biceps 2+  biceps 2+  triceps 2+  triceps 2+  patellar 2+  patellar 2+  ankle jerk 2+  ankle jerk 2+  Hoffman no  Hoffman no  plantar response down  plantar response down    SENSORY:  Normal and symmetric perception of light touch, pinprick, vibration, and proprioception.  Romberg's sign absent.  No extinction on double simultaneous stimulation.  Negative Tinel's sign at wrist and elbow.  COORDINATION/GAIT: Normal finger-to- nose-finger and heel-to-shin.  Intact rapid alternating movements bilaterally.  Able to rise from a chair without using arms.  Gait narrow based and stable. Tandem and stressed gait intact.    IMPRESSION: Ms. Mcveigh  is a 42 year old female presenting for evaluation of right arm pain and burning sensation.  Her neurological examination is entirely normal and non-focal.  There is point tenderness over the lateral elbow hear the radial head.   I would like to obtain and EMG to better characterize the nature of her symptoms since the distribution of her sensory complains may correspond to the distribution of the lateral antebrachial cutaneous nerve, but I am unable to elicit any clinical abnormalities on objective testing.  It is possible that she had localized trauma from hitting her elbow a few months ago which caused soft tissue swelling and impingement of small nerves as her symptoms are improving.  Pending the results of her EMG, a trial of low-dose steroids may be considered.    PLAN/RECOMMENDATIONS:  1. EMG of right upper extremity 2. Follow-up with PCP regarding left wrist popping/cracking  3. Return to clinic in 2 weeks for follow-up.  The duration of this appointment visit was 60 minutes of face-to-face time with the patient.  At least 50% of this time was spent in counseling, explanation of diagnosis, planning of further management, and coordination of care.   Thank you for allowing me to participate in patient's care.  If I can answer any additional questions, I would be pleased to do so.    Sincerely,    Donterius Filley K. Allena Katz, DO

## 2012-09-11 NOTE — Telephone Encounter (Signed)
Mary Armstrong   Ok to refer to [place] orth[facility]ics.  Any MD is fine    Pearletha Forge referral  To do

## 2012-09-11 NOTE — Telephone Encounter (Signed)
See Heather's note. Which ortho would you like me to set her up with.

## 2012-09-12 ENCOUNTER — Other Ambulatory Visit: Payer: Self-pay | Admitting: *Deleted

## 2012-09-12 DIAGNOSIS — M25532 Pain in left wrist: Secondary | ICD-10-CM

## 2012-09-12 NOTE — Telephone Encounter (Signed)
Appt made with [facility] on 9/8 at 2:45

## 2012-09-15 ENCOUNTER — Encounter: Payer: BC Managed Care – PPO | Admitting: Neurology

## 2012-09-24 ENCOUNTER — Encounter: Payer: Self-pay | Admitting: Obstetrics & Gynecology

## 2012-09-24 ENCOUNTER — Ambulatory Visit (INDEPENDENT_AMBULATORY_CARE_PROVIDER_SITE_OTHER): Payer: BC Managed Care – PPO | Admitting: Obstetrics & Gynecology

## 2012-09-24 VITALS — BP 103/70 | HR 67 | Resp 16 | Ht 62.0 in | Wt 138.0 lb

## 2012-09-24 DIAGNOSIS — D219 Benign neoplasm of connective and other soft tissue, unspecified: Secondary | ICD-10-CM

## 2012-09-24 DIAGNOSIS — D259 Leiomyoma of uterus, unspecified: Secondary | ICD-10-CM

## 2012-09-24 NOTE — Progress Notes (Signed)
  Subjective:    Patient ID: Mary Armstrong, female    DOB: 02/16/1970, 42 y.o.   MRN: [id number]  HPI  42 yo S [id number] here because she would like her fibroids removed. She had a myomectomy in 2002 and has been trying to conceive for the last year.  Review of Systems Pap 5/13 Mammogram next week scheduled     Objective:   Physical Exam        Assessment & Plan:  Fibroids causing menorrhagia, anemia, frequent urinationand pain. Still wants to conceive. I will order a MRI for location and size of fibroids and then refer her to Dr. Burnice LoganKatrinka Blazing who does myomectomies more than I do. She declines a flu vaccine today

## 2012-09-24 NOTE — Patient Instructions (Signed)
Fibroids Fibroids are lumps (tumors) that can occur any place in a woman's body. These lumps are not cancerous. Fibroids vary in size, weight, and where they grow. HOME CARE  Do not take aspirin.  Write down the number of pads or tampons you use during your period. Tell your doctor. This can help determine the best treatment for you. GET HELP RIGHT AWAY IF:  You have pain in your lower belly (abdomen) that is not helped with medicine.  You have cramps that are not helped with medicine.  You have more bleeding between or during your period.  You feel lightheaded or pass out (faint).  Your lower belly pain gets worse. MAKE SURE YOU:  Understand these instructions.  Will watch your condition.  Will get help right away if you are not doing well or get worse. Document Released: 01/27/2010 Document Revised: 03/19/2011 Document Reviewed: 01/27/2010 ExitCare Patient Information 2014 [employer].  

## 2012-09-25 ENCOUNTER — Ambulatory Visit: Payer: BC Managed Care – PPO | Admitting: Internal Medicine

## 2012-09-25 DIAGNOSIS — G608 Other hereditary and idiopathic neuropathies: Secondary | ICD-10-CM

## 2012-09-26 ENCOUNTER — Encounter: Payer: BC Managed Care – PPO | Admitting: Neurology

## 2012-09-29 ENCOUNTER — Ambulatory Visit: Payer: BC Managed Care – PPO | Admitting: Neurology

## 2012-10-01 ENCOUNTER — Other Ambulatory Visit: Payer: BC Managed Care – PPO

## 2012-10-15 ENCOUNTER — Telehealth: Payer: Self-pay | Admitting: Internal Medicine

## 2012-10-15 NOTE — Telephone Encounter (Signed)
Mary Armstrong    Call pt. And inform her that she needs to reschedule her diagnostic mammogram and ultrasound.  I note that she did not keep her 9/24 appt with xray.  She had an abnormal mammogram and needed follow up for both left and right breast  Give her the phone number to reschedule   Route back with her response

## 2012-10-15 NOTE — Telephone Encounter (Signed)
LVM regarding dx MM awaiting return call

## 2012-10-16 NOTE — Telephone Encounter (Signed)
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 Contacted [name] with [facility] at [facility] awaiting return call with possible solution.

## 2012-10-29 ENCOUNTER — Other Ambulatory Visit (HOSPITAL_COMMUNITY)
Admission: RE | Admit: 2012-10-29 | Discharge: 2012-10-29 | Disposition: A | Discharge status: Home / Self Care | Payer: Self-pay | Source: Ambulatory Visit | Attending: Obstetrics & Gynecology | Admitting: Obstetrics & Gynecology

## 2012-10-29 ENCOUNTER — Ambulatory Visit (INDEPENDENT_AMBULATORY_CARE_PROVIDER_SITE_OTHER): Payer: BC Managed Care – PPO | Admitting: Obstetrics & Gynecology

## 2012-10-29 ENCOUNTER — Encounter: Payer: Self-pay | Admitting: Obstetrics & Gynecology

## 2012-10-29 VITALS — BP 90/65 | HR 68 | Temp 97.0°F | Ht 62.0 in | Wt 136.1 lb

## 2012-10-29 DIAGNOSIS — D259 Leiomyoma of uterus, unspecified: Secondary | ICD-10-CM

## 2012-10-29 DIAGNOSIS — N938 Other specified abnormal uterine and vaginal bleeding: Secondary | ICD-10-CM

## 2012-10-29 DIAGNOSIS — N840 Polyp of corpus uteri: Secondary | ICD-10-CM | POA: Insufficient documentation

## 2012-10-29 DIAGNOSIS — N949 Unspecified condition associated with female genital organs and menstrual cycle: Secondary | ICD-10-CM

## 2012-10-29 DIAGNOSIS — D219 Benign neoplasm of connective and other soft tissue, unspecified: Secondary | ICD-10-CM

## 2012-10-29 NOTE — Progress Notes (Signed)
  Subjective:    Patient ID: Mary Armstrong, female    DOB: 1970/06/17, 42 y.o.   MRN: [id number]  HPI  42 yo lady with known fibroids who is here today to have an endometrial biopsy prior to a possible myomectomy. She again declines a hysterectomy.  Review of Systems She declines a flu vaccine today.    Objective:   Physical Exam   UPT negative, consent signed, time out done Cervix prepped with betadine and grasped with a single tooth tenaculum Uterus sounded to 9 cm Pipelle used for 2 passes with a moderate amount of tissue obtained. She tolerated the procedure well.       Assessment & Plan:   Pain from fibroids- await EMBX I have re ordered a pelvis MRI for deliniation of fibroids positions. She will come back and discuss this with Dr. Erin Fulling as I do not do myomectomies.

## 2012-10-29 NOTE — Patient Instructions (Signed)
Endometrial Biopsy This is a test in which a tissue sample (a biopsy) is taken from inside the uterus (womb). It is then looked at by a specialist under a microscope to see if the tissue is normal or abnormal. The endometrium is the lining of the uterus. This test helps determine where you are in your menstrual cycle and how hormone levels are affecting the lining of the uterus. Another use for this test is to diagnose endometrial cancer, tuberculosis, polyps, or inflammatory conditions and to evaluate uterine bleeding. PREPARATION FOR TEST No preparation or fasting is necessary. NORMAL FINDINGS No pathologic conditions. Presence of "secretory-type" endometrium 3 to 5 days before to normal menstruation. Ranges for normal findings may vary among different laboratories and hospitals. You should always check with your doctor after having lab work or other tests done to discuss the meaning of your test results and whether your values are considered within normal limits. MEANING OF TEST  Your caregiver will go over the test results with you and discuss the importance and meaning of your results, as well as treatment options and the need for additional tests if necessary. OBTAINING THE TEST RESULTS It is your responsibility to obtain your test results. Ask the lab or department performing the test when and how you will get your results. Document Released: 04/27/2004 Document Revised: 03/19/2011 Document Reviewed: 12/05/2007 ExitCare Patient Information 2014 [employer].  

## 2012-10-29 NOTE — Progress Notes (Signed)
Pt did not have regular period in Sept. Reports spotting brown in color until last week for one month.

## 2012-10-30 ENCOUNTER — Encounter: Payer: Self-pay | Admitting: *Deleted

## 2012-11-05 ENCOUNTER — Telehealth: Payer: Self-pay | Admitting: *Deleted

## 2012-11-05 ENCOUNTER — Encounter: Payer: Self-pay | Admitting: Internal Medicine

## 2012-11-05 NOTE — Telephone Encounter (Signed)
Called and spoke with Mary Armstrong let her know that we are sending her a certified letter about her results and the need for follow up. Also let her know that I spoke with [name] at [facility], and she should expect a call from [place] about an appointment about her breast mass follow up.

## 2012-11-05 NOTE — Telephone Encounter (Signed)
Addendum: 10/10 left VM message on pt phone with info to contact [name] with the [facility] so that she could get her Dx MM and [place]

## 2012-11-05 NOTE — Telephone Encounter (Signed)
Spoke with [place] about Mary Armstrong.  She will give Barber[place] a call about getting her enrolled in the [facility] program to address the need for diagnostic mammogram.

## 2012-11-19 ENCOUNTER — Ambulatory Visit: Payer: Self-pay | Admitting: Obstetrics & Gynecology

## 2012-11-21 ENCOUNTER — Other Ambulatory Visit (HOSPITAL_COMMUNITY): Payer: Self-pay | Admitting: *Deleted

## 2012-11-21 DIAGNOSIS — N63 Unspecified lump in unspecified breast: Secondary | ICD-10-CM

## 2012-11-24 ENCOUNTER — Ambulatory Visit (HOSPITAL_COMMUNITY): Admission: RE | Admit: 2012-11-24 | Payer: Self-pay | Source: Ambulatory Visit

## 2012-11-24 ENCOUNTER — Ambulatory Visit: Payer: Self-pay | Admitting: Obstetrics & Gynecology

## 2012-11-25 ENCOUNTER — Ambulatory Visit (HOSPITAL_COMMUNITY)
Admission: RE | Admit: 2012-11-25 | Discharge: 2012-11-25 | Disposition: A | Discharge status: Home / Self Care | Payer: Self-pay | Source: Ambulatory Visit | Attending: Obstetrics and Gynecology | Admitting: Obstetrics and Gynecology

## 2012-11-25 ENCOUNTER — Encounter (HOSPITAL_COMMUNITY): Payer: Self-pay

## 2012-11-25 ENCOUNTER — Encounter (INDEPENDENT_AMBULATORY_CARE_PROVIDER_SITE_OTHER): Payer: Self-pay

## 2012-11-25 VITALS — BP 108/64 | Temp 98.6°F | Ht 62.0 in | Wt 141.2 lb

## 2012-11-25 DIAGNOSIS — N6312 Unspecified lump in the right breast, upper inner quadrant: Secondary | ICD-10-CM | POA: Insufficient documentation

## 2012-11-25 DIAGNOSIS — N6321 Unspecified lump in the left breast, upper outer quadrant: Secondary | ICD-10-CM | POA: Insufficient documentation

## 2012-11-25 DIAGNOSIS — Z1239 Encounter for other screening for malignant neoplasm of breast: Secondary | ICD-10-CM

## 2012-11-25 NOTE — Patient Instructions (Signed)
Taught Mary Armstrong how to perform BSE and gave educational materials to take home. Patient did not need a Pap smear today due to last Pap smear was 05/11/2011. Let her know [facility] will cover Pap smears every 3 years unless has a history of abnormal Pap smears. Referred [facility] to [facility] for diagnostic mammogram and possible bilateral ultrasounds per recommendation. Appointment scheduled for Tuesday, December 09, 2012 at 1445. Patient aware of appointment and will be there. Mary Armstrong verbalized understanding.  Kobi Aller, Kathaleen Maser, RN 10:49 AM

## 2012-11-25 NOTE — Progress Notes (Addendum)
Patient referred to [facility] by the [facility] of [facility] due to recommending additional imaging of right and left breast. Screening mammogram completed 08/29/2012 at the [facility] [place]. Patient complained of a left moveable breast lump that she has had for 20 years that hasn't increased in size.   Pap Smear:  Pap smear not completed today. Last Pap smear was 05/11/2011 at the [facility] and normal. Per patient has no history of an abnormal Pap smear. Last Pap smear result is in EPIC.  Physical exam: Breasts Breasts symmetrical. No skin abnormalities bilateral breasts. No nipple retraction bilateral breasts. No nipple discharge bilateral breasts. No lymphadenopathy. Palpated moveable lump within the left breast at 2 o'clock 4 cm from the nipple. Palpated a right breast lump at 2 o'clock 6 cm from the nipple. No complaints of pain or tenderness on exam. Referred patient to the [facility] of [facility] for diagnostic mammogram and possible bilateral ultrasounds per recommendation. Appointment scheduled for Tuesday, December 09, 2012 at 1445.   Pelvic/Bimanual No Pap smear completed today since last Pap smear was 05/11/2011. Pap smear not indicated per BCCCP guidelines.

## 2012-12-02 ENCOUNTER — Other Ambulatory Visit: Payer: Self-pay

## 2012-12-02 ENCOUNTER — Other Ambulatory Visit: Payer: Self-pay | Admitting: Obstetrics and Gynecology

## 2012-12-02 DIAGNOSIS — N63 Unspecified lump in unspecified breast: Secondary | ICD-10-CM

## 2012-12-09 ENCOUNTER — Ambulatory Visit
Admission: RE | Admit: 2012-12-09 | Discharge: 2012-12-09 | Disposition: A | Discharge status: Home / Self Care | Payer: No Typology Code available for payment source | Source: Ambulatory Visit | Attending: Obstetrics and Gynecology | Admitting: Obstetrics and Gynecology

## 2012-12-09 DIAGNOSIS — N63 Unspecified lump in unspecified breast: Secondary | ICD-10-CM

## 2012-12-11 ENCOUNTER — Telehealth: Payer: Self-pay | Admitting: Internal Medicine

## 2012-12-11 DIAGNOSIS — N63 Unspecified lump in unspecified breast: Secondary | ICD-10-CM

## 2012-12-11 NOTE — Telephone Encounter (Signed)
Spoke with pt and informed of breast ultrasound report.    She has a grandmother with breast cancer.  Will refer to Breast clinic with Dr Donell Beers. Pt voices understanding

## 2012-12-12 ENCOUNTER — Telehealth: Payer: Self-pay | Admitting: *Deleted

## 2012-12-12 NOTE — Telephone Encounter (Signed)
Notified pt of appt on Jan 2 at 10am. Also notified pt that she will need to pay for visit up front per office staff. Pt expressed concern about financial situation and states that she will not be able to pay. Advised pt to call [facility] to see if they may can work with her

## 2012-12-15 NOTE — Telephone Encounter (Signed)
Appt made for pt in Jan. Pt states that she will not be able to keep this appt due to finances. Encouraged pt to call office to see is they will work with her.

## 2012-12-22 ENCOUNTER — Encounter: Payer: Self-pay | Admitting: Obstetrics & Gynecology

## 2013-01-09 ENCOUNTER — Ambulatory Visit (INDEPENDENT_AMBULATORY_CARE_PROVIDER_SITE_OTHER): Payer: Self-pay | Admitting: General Surgery

## 2013-01-29 ENCOUNTER — Encounter: Payer: Self-pay | Admitting: Obstetrics & Gynecology

## 2013-02-05 ENCOUNTER — Encounter: Payer: Self-pay | Admitting: Obstetrics & Gynecology

## 2013-02-09 ENCOUNTER — Ambulatory Visit (INDEPENDENT_AMBULATORY_CARE_PROVIDER_SITE_OTHER): Payer: Self-pay | Admitting: General Surgery

## 2013-02-11 ENCOUNTER — Encounter (INDEPENDENT_AMBULATORY_CARE_PROVIDER_SITE_OTHER): Payer: Self-pay | Admitting: General Surgery

## 2013-03-02 ENCOUNTER — Telehealth: Payer: Self-pay | Admitting: *Deleted

## 2013-03-02 ENCOUNTER — Other Ambulatory Visit: Payer: Self-pay | Admitting: *Deleted

## 2013-03-02 NOTE — Telephone Encounter (Signed)
Needs a refill of her valACYclovir (VALTREX) 500 MG tablet.

## 2013-03-02 NOTE — Telephone Encounter (Signed)
Refill request

## 2013-03-03 MED ORDER — VALACYCLOVIR HCL 500 MG PO TABS: 500.0000 mg | ORAL_TABLET | Freq: Two times a day (BID) | ORAL | Status: DC

## 2013-03-04 ENCOUNTER — Other Ambulatory Visit: Payer: Self-pay | Admitting: *Deleted

## 2013-03-04 NOTE — Telephone Encounter (Signed)
Mary Armstrong wants a year supply.  She said that she needs this on and off all the time, and she would like a prescription where she is able to get it when needed.

## 2013-03-06 NOTE — Telephone Encounter (Signed)
See Heather's note 

## 2013-03-09 MED ORDER — VALACYCLOVIR HCL 500 MG PO TABS: 500.0000 mg | ORAL_TABLET | Freq: Two times a day (BID) | ORAL | Status: DC

## 2013-03-09 NOTE — Telephone Encounter (Signed)
Mary Armstrong  Call pt and let her know that I have not seen her since August of last year   I can only give her 5 refills and then she will need to see me in office

## 2013-03-10 NOTE — Telephone Encounter (Signed)
LVM message to return call regarding her RX

## 2013-03-18 ENCOUNTER — Encounter: Payer: Self-pay | Admitting: Obstetrics & Gynecology

## 2013-04-13 ENCOUNTER — Encounter: Payer: Self-pay | Admitting: Obstetrics & Gynecology

## 2013-04-13 ENCOUNTER — Ambulatory Visit (INDEPENDENT_AMBULATORY_CARE_PROVIDER_SITE_OTHER): Payer: Self-pay | Admitting: Obstetrics & Gynecology

## 2013-04-13 VITALS — BP 127/84 | HR 73 | Temp 97.9°F | Ht 63.0 in | Wt 141.3 lb

## 2013-04-13 DIAGNOSIS — N949 Unspecified condition associated with female genital organs and menstrual cycle: Secondary | ICD-10-CM

## 2013-04-13 DIAGNOSIS — N938 Other specified abnormal uterine and vaginal bleeding: Secondary | ICD-10-CM

## 2013-04-13 DIAGNOSIS — N925 Other specified irregular menstruation: Secondary | ICD-10-CM

## 2013-04-13 DIAGNOSIS — D219 Benign neoplasm of connective and other soft tissue, unspecified: Secondary | ICD-10-CM

## 2013-04-13 DIAGNOSIS — D259 Leiomyoma of uterus, unspecified: Secondary | ICD-10-CM

## 2013-04-13 MED ORDER — MEGESTROL ACETATE 20 MG PO TABS: 80.0000 mg | ORAL_TABLET | Freq: Two times a day (BID) | ORAL | Status: DC

## 2013-04-13 NOTE — Progress Notes (Addendum)
CLINIC ENCOUNTER NOTE  History:  43 y.o. [id number] here today for surgical consultation for fibroids.  She adamantly declines a hysterectomy, has met with Dr. Hulan Fray in the past to discuss this. Still has significant AUB.  The following portions of the patient's history were reviewed and updated as appropriate: allergies, current medications, past family history, past medical history, past social history, past surgical history and problem list. Normal pap in 05/11/2011.  Review of Systems:  Pertinent items are noted in HPI.  Objective:  Physical Exam BP 127/84  Pulse 73  Temp(Src) 97.9 F (36.6 C) (Oral)  Ht 5' 3" (1.6 m)  Wt 141 lb 4.8 oz (64.093 kg)  BMI 25.04 kg/m2  LMP 04/05/2013 Gen: NAD Abd: Soft, nontender and nondistended Pelvic: Deferred  Labs and Imaging 10/29/12 Endometrium, biopsy - ENDOMETRIOID TYPE POLYP(S). - BENIGN SECRETORY TYPE ENDOMETRIUM. - BENIGN SQUAMOUS EPITHELIUM. - THERE IS NO EVIDENCE OF HYPERPLASIA OR MALIGNANCY.  Assessment & Plan:  Patient declined Lupron or other hormonal/medical therapy.  Risks of myomectomy were discussed with the patient including but not limited to: bleeding which may require transfusion or reoperation and hysterectomy; infection which may require antibiotics; injury to bowel, bladder, ureters or other surrounding organs; need for additional procedures; formation of intraperitoneal adhesions that may lead to other complications or make future surgeries more difficult;  thromboembolic phenomenon, incisional problems and other postoperative/anesthesia complications.  Also discussed possible need for cesarean deliveries at 55 - [redacted] weeks gestation for subsequent pregnancies if the endometrial cavity is breached and possible recurrence of fibroids.  Patient verbalized understanding of all these risks and wanted to proceed.  Postoperative expectations were also discussed in detail. The patient also understands the alternative treatment  options which were discussed in full. All questions were answered.  She was told that she will be contacted by our surgical scheduler regarding the time and date of her surgery; routine preoperative instructions of having nothing to eat or drink after midnight on the day prior to surgery and also coming to the hospital 1.5 hours prior to her time of surgery were also emphasized.  She was told she may be called for a preoperative appointment about a week prior to surgery and will be given further preoperative instructions at that visit. Printed patient education handouts about the procedure were given to the patient to review at home.    Mary Schneiders, MD, [place] Attending [place], [place]

## 2013-04-13 NOTE — Patient Instructions (Signed)
Myomectomy Myomectomy is surgery to remove a noncancerous tumor (myoma) from the uterus. Myomas are tumors made up of fibrous tissue. They are often called fibroid tumors. Fibroid tumors can range from the size of a pea to the size of a grapefruit. In a myomectomy, the fibroid tumor is removed without removing the uterus. Because these tumors are rarely cancerous, this surgery is usually done only if the tumor is growing or causing symptoms such as pain, pressure, bleeding, or pain with intercourse. LET YOUR HEALTH CARE PROVIDER KNOW ABOUT:  Any allergies you have.  All medicines you are taking, including vitamins, herbs, eye drops, creams, and over-the-counter medicines.  Previous problems you or members of your family have had with the use of anesthetics.  Any blood disorders you have.  Previous surgeries you have had.  Medical conditions you have. RISKS AND COMPLICATIONS  Generally, this is a safe procedure. However, as with any procedure, complications can occur. Possible complications include:  Excessive bleeding.  Infection.  Injury to nearby organs.  Blood clots in the legs, chest, or brain.  Scar tissue on other organs and in the pelvis. This may require another surgery to remove the scar tissue. BEFORE THE PROCEDURE  Ask your health care provider about changing or stopping your regular medicines. Avoid taking aspirin or blood thinners as directed by your health care provider.  Do not  eat or drink anything after midnight on the night before surgery.  If you smoke, do not  smoke for 2 weeks before the surgery.  Do not  drink alcohol the day before the surgery.  Arrange for someone to drive you home after the procedure or after your hospital stay. Also arrange for someone to help you with activities during your recovery. PROCEDURE You will be given medicine to make you sleep through the procedure (general anesthetic). Any of the following methods may be used to perform a  myomectomy:  Small monitors will be put on your body. They are used to check your heart, blood pressure, and oxygen level.  An IV access tube will be put into one of your veins. Medicine will be able to flow directly into your body through this IV tube.  You might be given a medicine to help you relax (sedative).  You will be given a medicine to make you sleep (general anesthetic). A breathing tube will be placed into your lungs during the procedure.  A thin, flexible tube (catheter) will be inserted into your bladder to collect urine.  Any of the following methods may be used to perform a myomectomy:  Hysteroscopic myomectomy This method may be used when the fibroid tumor is inside the cavity of the uterus. A long, thin tube that is like a telescope (hysteroscope) is inserted inside the uterus. A saline solution is put into your uterus. This expands the uterus and allows the surgeon to see the fibroids. Tools are passed through the hysteroscope to remove the fibroid tumor in pieces.  Laparoscopic myomectomy A few small cuts (incisions) are made in the lower abdomen. A thin, lighted tube with a tiny camera on the end (laparoscope) is inserted through one of the incisions. This gives the surgeon a good view of the area. The fibroid tumor is removed through the other incisions. The incisions are then closed with stitches (sutures) or staples.  Abdominal myomectomy This method is used when the fibroid tumor cannot be removed with a hysteroscope or laparoscope. The surgery is performed through a larger surgical incision in   the abdomen. The fibroid tumor is removed through this incision. The incision is closed with sutures or staples. AFTER THE PROCEDURE  If you had a laparoscopic or hysteroscopic myomectomy, you may be able to go home the same day, or you may need to stay in the hospital overnight.  If you had an abdominal myomectomy, you may need to stay in the hospital for a few days.  Your IV  access tube and catheter will be removed in 1 2 days.  You may be given medicine for pain or to help you sleep.  You may be given an antibiotic medicine, if needed. Document Released: 10/22/2006 Document Revised: 10/15/2012 Document Reviewed: 08/06/2012 [employer] Patient Information 2014 [employer].  

## 2013-05-22 ENCOUNTER — Ambulatory Visit (HOSPITAL_COMMUNITY)
Admission: RE | Admit: 2013-05-22 | Discharge: 2013-05-22 | Disposition: A | Discharge status: Home / Self Care | Payer: Self-pay | Source: Ambulatory Visit | Attending: Obstetrics & Gynecology | Admitting: Obstetrics & Gynecology

## 2013-05-22 DIAGNOSIS — N938 Other specified abnormal uterine and vaginal bleeding: Secondary | ICD-10-CM

## 2013-05-22 DIAGNOSIS — D251 Intramural leiomyoma of uterus: Secondary | ICD-10-CM | POA: Insufficient documentation

## 2013-05-22 DIAGNOSIS — D250 Submucous leiomyoma of uterus: Secondary | ICD-10-CM | POA: Insufficient documentation

## 2013-05-22 DIAGNOSIS — D219 Benign neoplasm of connective and other soft tissue, unspecified: Secondary | ICD-10-CM

## 2013-05-27 ENCOUNTER — Telehealth: Payer: Self-pay | Admitting: *Deleted

## 2013-05-27 NOTE — Telephone Encounter (Signed)
Called [place] left message we are calling with some information. Please call clinic.

## 2013-05-27 NOTE — Telephone Encounter (Signed)
Message copied by Samuel Germany on Wed May 27, 2013  4:04 PM ------      Message from: Verita Schneiders A      Created: Tue May 26, 2013  2:16 PM       Multiple fibroids.  Please call to inform patient of results.       ------

## 2013-05-28 NOTE — Telephone Encounter (Signed)
Patient informed of results. Patient states that she cannot come to the appointment on June 18 with Dr. Harolyn Rutherford because she is going to [place]. She needs to reschedule this appointment. Dr Harolyn Rutherford only has OB appointments around that time frame. Will send message to her to see if she wants to [facility] or bring her in at a different time.   FYI patient states that the best time to call her is after 330 pm.

## 2013-05-28 NOTE — Telephone Encounter (Signed)
Called patient, no answer, left message that we are attempting to contact her and for to call the office.

## 2013-05-29 NOTE — Telephone Encounter (Signed)
She can come anytime before her surgery on 07/14/13 for her preoperative appointment.

## 2013-06-02 NOTE — Telephone Encounter (Signed)
Called Mary Armstrong- left message we are rescheduling her appointment- registar will call her in the next few days to reschedule.

## 2013-06-04 ENCOUNTER — Telehealth: Payer: Self-pay | Admitting: General Practice

## 2013-06-04 NOTE — Telephone Encounter (Signed)
Patient called and left message stating she is returning our phone call. Called patient, no answer- left message that we are returning your phone call, please call us back at the clinics

## 2013-06-05 NOTE — Telephone Encounter (Signed)
Pt called nurse line and states she is returning our call.  Called patient, no answer, left message that we are attempting to call her for her to return call to the clinic.

## 2013-06-09 NOTE — Telephone Encounter (Signed)
Called pt and pt informed me that she had an appt with [place] on 06/25/13 that she could not make it, it is for her to see provider before surgery on 07/14/13.  Spoke with Cathlean Marseilles to schedule another appt.  Appt rescheduled for 06/19/13 @0900 .  I explained to the pt that Dr. Harolyn Rutherford will be covering the whole hospital so there may be a little wait.  Pt stated "ok, that's fine".

## 2013-06-19 ENCOUNTER — Ambulatory Visit (INDEPENDENT_AMBULATORY_CARE_PROVIDER_SITE_OTHER): Payer: Self-pay | Admitting: Obstetrics & Gynecology

## 2013-06-19 ENCOUNTER — Encounter: Payer: Self-pay | Admitting: Obstetrics & Gynecology

## 2013-06-19 VITALS — BP 102/62 | HR 58 | Temp 97.7°F | Ht 62.0 in | Wt 138.1 lb

## 2013-06-19 DIAGNOSIS — Z01818 Encounter for other preprocedural examination: Secondary | ICD-10-CM

## 2013-06-19 DIAGNOSIS — R350 Frequency of micturition: Secondary | ICD-10-CM

## 2013-06-19 LAB — POCT URINALYSIS DIP (DEVICE)
Bilirubin Urine: NEGATIVE
Glucose, UA: NEGATIVE mg/dL
Ketones, ur: NEGATIVE mg/dL
Leukocytes, UA: NEGATIVE
NITRITE: NEGATIVE
PH: 5.5 (ref 5.0–8.0)
Protein, ur: NEGATIVE mg/dL
Specific Gravity, Urine: >=1.03 (ref 1.005–1.030)
Urobilinogen, UA: 0.2 mg/dL (ref 0.0–1.0)

## 2013-06-19 NOTE — Progress Notes (Signed)
CLINIC ENCOUNTER NOTE  History:  43 y.o. [id number] here today for preoperative encounter prior to myomectomy scheduled on 07/14/2013. History of previous myomectomy in 2005.   Reports increased urinary frequency, no dysuria. No other GYN concerns.  The following portions of the patient's history were reviewed and updated as appropriate: allergies, current medications, past family history, past medical history, past social history, past surgical history and problem list.  Review of Systems:  Pertinent items are noted in HPI.  Objective:  Physical Exam BP 102/62  Pulse 58  Temp(Src) 97.7 F (36.5 C) (Oral)  Ht 5\' 2"  (1.575 m)  Wt 138 lb 1.6 oz (62.642 kg)  BMI 25.25 kg/m2 Gen: NAD Abd: Soft, nontender and nondistended Pelvic: Deferred  Labs and Imaging 05/22/2013   TRANSABDOMINAL AND TRANSVAGINAL ULTRASOUND OF PELVIS  CLINICAL DATA:  Evaluate fibroids, status post myomectomy TECHNIQUE: Both transabdominal and transvaginal ultrasound examinations of the pelvis were performed. Transabdominal technique was performed for global imaging of the pelvis including uterus, ovaries, adnexal regions, and pelvic cul-de-sac. It was necessary to proceed with endovaginal exam following the transabdominal exam to visualize the endometrium.  COMPARISON:  05/03/2011  FINDINGS: Uterus  Measurements: 12.9 x 8.3 x 8.9 cm. Multiple uterine fibroids, including:  --5.4 x 5.2 x 5.6 cm transmural left fundal fibroid, previously 4.5 x 5.2 x 4.3 cm  --3.9 x 3.9 x 3.1 cm intramural/subserosal left posterior uterine body fibroid  --2.1 x 2.3 x 2.4 cm intramural/submucosal right posterior fundal fibroid  --2.2 x 2.1 x 1.6 cm subserosal right uterine body fibroid  Endometrium  Thickness: 13 mm.  No focal abnormality visualized.  Right ovary  Measurements: 2.7 x 1.4 x 2.1 cm. Normal appearance/no adnexal mass.  Left ovary  Measurements: 3.8 x 2.1 x 3.1 cm. Normal appearance/no adnexal mass.  Other findings  No free fluid.   IMPRESSION: Multiple uterine fibroids, as above.  Dominant 5.6 cm transmural left fundal fibroid is minimally increased from 2013.   Electronically Signed   By: Julian Hy M.D.   On: 05/22/2013 09:55   06/19/2013 UA neg; urine culture sent  Assessment & Plan:   Risks of myomectomy were discussed with the patient including but not limited to: bleeding which may require transfusion or reoperation and hysterectomy; infection which may require antibiotics; injury to bowel, bladder, ureters or other surrounding organs; need for additional procedures; formation of intraperitoneal adhesions that may lead to other complications or make future surgeries more difficult; thromboembolic phenomenon, incisional problems and other postoperative/anesthesia complications. Also discussed possible need for cesarean deliveries at 20 - [redacted] weeks gestation for subsequent pregnancies if the endometrial cavity is breached and possible recurrence of fibroids. Patient verbalized understanding of all these risks and wanted to proceed. Postoperative expectations were also discussed in detail. The patient also understands the alternative treatment options which were discussed in full. All questions were answered. She was told that she will be contacted by our surgical scheduler closer to the time of surgery; routine preoperative instructions of having nothing to eat or drink after midnight on the day prior to surgery and also coming to the hospital 1.5 hours prior to her time of surgery were also emphasized. She was told she may be called for a preoperative appointment about a week prior to surgery and will be given further preoperative instructions at that visit. Printed patient education handouts about the procedure were given to the patient to review at home.  Will follow up urine culture results and manage accordingly.  Verita Schneiders, MD, [place] Attending [place], [facility]

## 2013-06-19 NOTE — Progress Notes (Signed)
Patient reports having frequent urination and the pressure to go-- requests UA to r/o UTI.

## 2013-06-19 NOTE — Patient Instructions (Signed)
Myomectomy Myomectomy is surgery to remove a noncancerous tumor (myoma) from the uterus. Myomas are tumors made up of fibrous tissue. They are often called fibroid tumors. Fibroid tumors can range from the size of a pea to the size of a grapefruit. In a myomectomy, the fibroid tumor is removed without removing the uterus. Because these tumors are rarely cancerous, this surgery is usually done only if the tumor is growing or causing symptoms such as pain, pressure, bleeding, or pain with intercourse. LET [facility] CARE PROVIDER KNOW ABOUT:  Any allergies you have.  All medicines you are taking, including vitamins, herbs, eye drops, creams, and over-the-counter medicines.  Previous problems you or members of your family have had with the use of anesthetics.  Any blood disorders you have.  Previous surgeries you have had.  Medical conditions you have. RISKS AND COMPLICATIONS  Generally, this is a safe procedure. However, as with any procedure, complications can occur. Possible complications include:  Excessive bleeding.  Infection.  Injury to nearby organs.  Blood clots in the legs, chest, or brain.  Scar tissue on other organs and in the pelvis. This may require another surgery to remove the scar tissue. BEFORE THE PROCEDURE  Ask your health care provider about changing or stopping your regular medicines. Avoid taking aspirin or blood thinners as directed by your health care provider.  Do not  eat or drink anything after midnight on the night before surgery.  If you smoke, do not  smoke for 2 weeks before the surgery.  Do not  drink alcohol the day before the surgery.  Arrange for someone to drive you home after the procedure or after your hospital stay. Also arrange for someone to help you with activities during your recovery. PROCEDURE You will be given medicine to make you sleep through the procedure (general anesthetic). Any of the following methods may be used to perform a  myomectomy:  Small monitors will be put on your body. They are used to check your heart, blood pressure, and oxygen level.  An IV access tube will be put into one of your veins. Medicine will be able to flow directly into your body through this IV tube.  You might be given a medicine to help you relax (sedative).  You will be given a medicine to make you sleep (general anesthetic). A breathing tube will be placed into your lungs during the procedure.  A thin, flexible tube (catheter) will be inserted into your bladder to collect urine.  Any of the following methods may be used to perform a myomectomy:  Hysteroscopic myomectomy This method may be used when the fibroid tumor is inside the cavity of the uterus. A long, thin tube that is like a telescope (hysteroscope) is inserted inside the uterus. A saline solution is put into your uterus. This expands the uterus and allows the surgeon to see the fibroids. Tools are passed through the hysteroscope to remove the fibroid tumor in pieces.  Laparoscopic myomectomy A few small cuts (incisions) are made in the lower abdomen. A thin, lighted tube with a tiny camera on the end (laparoscope) is inserted through one of the incisions. This gives the surgeon a good view of the area. The fibroid tumor is removed through the other incisions. The incisions are then closed with stitches (sutures) or staples.  Abdominal myomectomy This method is used when the fibroid tumor cannot be removed with a hysteroscope or laparoscope. The surgery is performed through a larger surgical incision in  the abdomen. The fibroid tumor is removed through this incision. The incision is closed with sutures or staples. AFTER THE PROCEDURE  If you had a laparoscopic or hysteroscopic myomectomy, you may be able to go home the same day, or you may need to stay in the hospital overnight.  If you had an abdominal myomectomy, you may need to stay in the hospital for a few days.  Your IV  access tube and catheter will be removed in 1 2 days.  You may be given medicine for pain or to help you sleep.  You may be given an antibiotic medicine, if needed. Document Released: 10/22/2006 Document Revised: 10/15/2012 Document Reviewed: 08/06/2012 [facility] Patient Information 2014 [employer].

## 2013-06-21 LAB — URINE CULTURE
Colony Count: NO GROWTH
Organism ID, Bacteria: NO GROWTH

## 2013-06-25 ENCOUNTER — Ambulatory Visit: Payer: Self-pay | Admitting: Obstetrics & Gynecology

## 2013-07-01 ENCOUNTER — Encounter (HOSPITAL_COMMUNITY): Payer: Self-pay | Admitting: Pharmacist

## 2013-07-09 NOTE — Patient Instructions (Signed)
Your procedure is scheduled on: Tuesday, July 14, 2013  Enter through the [employer] of [facility] at: 8:45 am  Pick up the phone at the desk and dial [phone].  Call this number if you have problems the morning of surgery: [phone].  Remember: Do NOT eat food:  After Midnight Monday Do NOT drink clear liquids after: After Midnight Monday Take these medicines the morning of surgery with a SIP OF WATER: None  Do NOT wear jewelry (body piercing), metal hair clips/bobby pins, make-up, or nail polish. Do NOT wear lotions, powders, or perfumes.  You may wear deoderant. Do NOT shave for 48 hours prior to surgery. Do NOT bring valuables to the hospital. Contacts, dentures, or bridgework may not be worn into surgery. Leave suitcase in car.  After surgery it may be brought to your room.  For patients admitted to the hospital, checkout time is 11:00 AM the day of discharge.

## 2013-07-13 ENCOUNTER — Encounter (HOSPITAL_COMMUNITY)
Admission: RE | Admit: 2013-07-13 | Discharge: 2013-07-13 | Disposition: A | Discharge status: Home / Self Care | Payer: Self-pay | Source: Ambulatory Visit | Attending: Obstetrics & Gynecology | Admitting: Obstetrics & Gynecology

## 2013-07-13 ENCOUNTER — Encounter (HOSPITAL_COMMUNITY): Payer: Self-pay

## 2013-07-13 DIAGNOSIS — Z01812 Encounter for preprocedural laboratory examination: Secondary | ICD-10-CM | POA: Insufficient documentation

## 2013-07-13 HISTORY — DX: Anemia, unspecified: D64.9

## 2013-07-13 LAB — CBC
HEMATOCRIT: 35.8 % — AB (ref 36.0–46.0)
Hemoglobin: 11.8 g/dL — ABNORMAL LOW (ref 12.0–15.0)
MCH: 31.1 pg (ref 26.0–34.0)
MCHC: 33 g/dL (ref 30.0–36.0)
MCV: 94.5 fL (ref 78.0–100.0)
Platelets: 293 10*3/uL (ref 150–400)
RBC: 3.79 MIL/uL — AB (ref 3.87–5.11)
RDW: 14.2 % (ref 11.5–15.5)
WBC: 6.9 10*3/uL (ref 4.0–10.5)

## 2013-07-14 ENCOUNTER — Encounter (HOSPITAL_COMMUNITY): Payer: Self-pay | Admitting: Anesthesiology

## 2013-07-14 ENCOUNTER — Encounter (HOSPITAL_COMMUNITY): Payer: Self-pay

## 2013-07-14 ENCOUNTER — Encounter (HOSPITAL_COMMUNITY)
Admission: RE | Disposition: A | Discharge status: Home / Self Care | Payer: Self-pay | Source: Ambulatory Visit | Attending: Obstetrics & Gynecology

## 2013-07-14 ENCOUNTER — Inpatient Hospital Stay (HOSPITAL_COMMUNITY)
Admission: RE | Admit: 2013-07-14 | Discharge: 2013-07-16 | DRG: 742 | Disposition: A | Discharge status: Home / Self Care | Payer: Self-pay | Source: Ambulatory Visit | Attending: Obstetrics & Gynecology | Admitting: Obstetrics & Gynecology

## 2013-07-14 ENCOUNTER — Ambulatory Visit (HOSPITAL_COMMUNITY): Payer: Self-pay | Admitting: Anesthesiology

## 2013-07-14 DIAGNOSIS — D250 Submucous leiomyoma of uterus: Secondary | ICD-10-CM | POA: Diagnosis present

## 2013-07-14 DIAGNOSIS — D252 Subserosal leiomyoma of uterus: Secondary | ICD-10-CM | POA: Diagnosis present

## 2013-07-14 DIAGNOSIS — N831 Corpus luteum cyst of ovary, unspecified side: Secondary | ICD-10-CM | POA: Diagnosis present

## 2013-07-14 DIAGNOSIS — Y9210 Unspecified residential institution as the place of occurrence of the external cause: Secondary | ICD-10-CM | POA: Diagnosis present

## 2013-07-14 DIAGNOSIS — N949 Unspecified condition associated with female genital organs and menstrual cycle: Secondary | ICD-10-CM

## 2013-07-14 DIAGNOSIS — N938 Other specified abnormal uterine and vaginal bleeding: Secondary | ICD-10-CM

## 2013-07-14 DIAGNOSIS — N8000 Endometriosis of the uterus, unspecified: Secondary | ICD-10-CM | POA: Diagnosis present

## 2013-07-14 DIAGNOSIS — Z9889 Other specified postprocedural states: Secondary | ICD-10-CM

## 2013-07-14 DIAGNOSIS — Y838 Other surgical procedures as the cause of abnormal reaction of the patient, or of later complication, without mention of misadventure at the time of the procedure: Secondary | ICD-10-CM | POA: Diagnosis present

## 2013-07-14 DIAGNOSIS — N838 Other noninflammatory disorders of ovary, fallopian tube and broad ligament: Secondary | ICD-10-CM | POA: Diagnosis present

## 2013-07-14 DIAGNOSIS — D219 Benign neoplasm of connective and other soft tissue, unspecified: Secondary | ICD-10-CM | POA: Diagnosis present

## 2013-07-14 DIAGNOSIS — N736 Female pelvic peritoneal adhesions (postinfective): Secondary | ICD-10-CM | POA: Diagnosis present

## 2013-07-14 DIAGNOSIS — K560 Paralytic ileus: Secondary | ICD-10-CM

## 2013-07-14 DIAGNOSIS — D649 Anemia, unspecified: Secondary | ICD-10-CM | POA: Diagnosis present

## 2013-07-14 DIAGNOSIS — K929 Disease of digestive system, unspecified: Secondary | ICD-10-CM | POA: Diagnosis not present

## 2013-07-14 DIAGNOSIS — Z90710 Acquired absence of both cervix and uterus: Secondary | ICD-10-CM | POA: Diagnosis present

## 2013-07-14 DIAGNOSIS — D251 Intramural leiomyoma of uterus: Secondary | ICD-10-CM | POA: Diagnosis present

## 2013-07-14 HISTORY — PX: SALPINGOOPHORECTOMY: SHX82

## 2013-07-14 HISTORY — PX: MYOMECTOMY: SHX85

## 2013-07-14 HISTORY — PX: LYSIS OF ADHESION: SHX5961

## 2013-07-14 HISTORY — PX: SUPRACERVICAL ABDOMINAL HYSTERECTOMY: SHX5393

## 2013-07-14 LAB — ABO/RH: ABO/RH(D): O POS

## 2013-07-14 LAB — TYPE AND SCREEN
ABO/RH(D): O POS
Antibody Screen: NEGATIVE

## 2013-07-14 LAB — PREGNANCY, URINE: PREG TEST UR: NEGATIVE

## 2013-07-14 SURGERY — MYOMECTOMY, ABDOMINAL APPROACH
Anesthesia: General | Site: Abdomen

## 2013-07-14 MED ORDER — LORAZEPAM 1 MG PO TABS: 1.0000 mg | ORAL_TABLET | Freq: Four times a day (QID) | ORAL | Status: DC | PRN

## 2013-07-14 MED ORDER — SODIUM CHLORIDE 0.9 % IJ SOLN: 9.0000 mL | INTRAMUSCULAR | Status: DC | PRN

## 2013-07-14 MED ORDER — ONDANSETRON HCL 4 MG/2ML IJ SOLN
4.0000 mg | Freq: Four times a day (QID) | INTRAMUSCULAR | Status: DC | PRN
  Administered 2013-07-14 – 2013-07-15 (×3): 4 mg via INTRAVENOUS
  Filled 2013-07-14 (×3): qty 2

## 2013-07-14 MED ORDER — HYDROMORPHONE HCL PF 1 MG/ML IJ SOLN
INTRAMUSCULAR | Status: AC
  Administered 2013-07-14: 0.5 mg via INTRAVENOUS
  Filled 2013-07-14: qty 1

## 2013-07-14 MED ORDER — FENTANYL CITRATE 0.05 MG/ML IJ SOLN
INTRAMUSCULAR | Status: DC | PRN
  Administered 2013-07-14: 50 ug via INTRAVENOUS
  Administered 2013-07-14: 100 ug via INTRAVENOUS
  Administered 2013-07-14 (×2): 50 ug via INTRAVENOUS
  Administered 2013-07-14: 100 ug via INTRAVENOUS
  Administered 2013-07-14 (×2): 50 ug via INTRAVENOUS

## 2013-07-14 MED ORDER — KETOROLAC TROMETHAMINE 30 MG/ML IJ SOLN
INTRAMUSCULAR | Status: AC
  Filled 2013-07-14: qty 1

## 2013-07-14 MED ORDER — DEXAMETHASONE SODIUM PHOSPHATE 10 MG/ML IJ SOLN
INTRAMUSCULAR | Status: AC
  Filled 2013-07-14: qty 1

## 2013-07-14 MED ORDER — MIDAZOLAM HCL 2 MG/2ML IJ SOLN
INTRAMUSCULAR | Status: AC
  Administered 2013-07-14: 2 mg
  Filled 2013-07-14: qty 2

## 2013-07-14 MED ORDER — DEXAMETHASONE SODIUM PHOSPHATE 10 MG/ML IJ SOLN
INTRAMUSCULAR | Status: DC | PRN
  Administered 2013-07-14: 10 mg via INTRAVENOUS

## 2013-07-14 MED ORDER — LACTATED RINGERS IV SOLN
INTRAVENOUS | Status: DC
  Administered 2013-07-14: 10:00:00 via INTRAVENOUS

## 2013-07-14 MED ORDER — VALACYCLOVIR HCL 500 MG PO TABS
500.0000 mg | ORAL_TABLET | Freq: Two times a day (BID) | ORAL | Status: DC
  Administered 2013-07-15 – 2013-07-16 (×3): 500 mg via ORAL
  Filled 2013-07-14 (×5): qty 1

## 2013-07-14 MED ORDER — LORAZEPAM 2 MG/ML IJ SOLN: 1.0000 mg | Freq: Four times a day (QID) | INTRAMUSCULAR | Status: DC | PRN

## 2013-07-14 MED ORDER — DOCUSATE SODIUM 100 MG PO CAPS
100.0000 mg | ORAL_CAPSULE | Freq: Two times a day (BID) | ORAL | Status: DC
  Administered 2013-07-14 – 2013-07-16 (×4): 100 mg via ORAL
  Filled 2013-07-14 (×4): qty 1

## 2013-07-14 MED ORDER — PROMETHAZINE HCL 25 MG/ML IJ SOLN: 6.2500 mg | INTRAMUSCULAR | Status: DC | PRN

## 2013-07-14 MED ORDER — HYDROMORPHONE HCL PF 1 MG/ML IJ SOLN
INTRAMUSCULAR | Status: AC
  Filled 2013-07-14: qty 1

## 2013-07-14 MED ORDER — 0.9 % SODIUM CHLORIDE (POUR BTL) OPTIME
TOPICAL | Status: DC | PRN
  Administered 2013-07-14: 1000 mL

## 2013-07-14 MED ORDER — LIDOCAINE HCL (CARDIAC) 20 MG/ML IV SOLN
INTRAVENOUS | Status: AC
  Filled 2013-07-14: qty 5

## 2013-07-14 MED ORDER — CEFAZOLIN SODIUM-DEXTROSE 2-3 GM-% IV SOLR
2.0000 g | INTRAVENOUS | Status: AC
  Administered 2013-07-14: 2 g via INTRAVENOUS

## 2013-07-14 MED ORDER — SIMETHICONE 80 MG PO CHEW: 80.0000 mg | CHEWABLE_TABLET | Freq: Four times a day (QID) | ORAL | Status: DC | PRN

## 2013-07-14 MED ORDER — DIPHENHYDRAMINE HCL 50 MG/ML IJ SOLN: 12.5000 mg | Freq: Four times a day (QID) | INTRAMUSCULAR | Status: DC | PRN

## 2013-07-14 MED ORDER — FENTANYL CITRATE 0.05 MG/ML IJ SOLN
INTRAMUSCULAR | Status: AC
  Filled 2013-07-14: qty 5

## 2013-07-14 MED ORDER — DIPHENHYDRAMINE HCL 12.5 MG/5ML PO ELIX: 12.5000 mg | ORAL_SOLUTION | Freq: Four times a day (QID) | ORAL | Status: DC | PRN

## 2013-07-14 MED ORDER — MEPERIDINE HCL 25 MG/ML IJ SOLN: 6.2500 mg | INTRAMUSCULAR | Status: DC | PRN

## 2013-07-14 MED ORDER — MIDAZOLAM BOLUS VIA INFUSION: 2.0000 mg | Freq: Once | INTRAVENOUS | Status: DC

## 2013-07-14 MED ORDER — LIDOCAINE HCL (CARDIAC) 20 MG/ML IV SOLN
INTRAVENOUS | Status: DC | PRN
  Administered 2013-07-14: 80 mg via INTRAVENOUS

## 2013-07-14 MED ORDER — IBUPROFEN 600 MG PO TABS
600.0000 mg | ORAL_TABLET | Freq: Four times a day (QID) | ORAL | Status: DC | PRN
  Administered 2013-07-15 – 2013-07-16 (×3): 600 mg via ORAL
  Filled 2013-07-14 (×3): qty 1

## 2013-07-14 MED ORDER — LORAZEPAM 2 MG/ML IJ SOLN
0.5000 mg | Freq: Four times a day (QID) | INTRAMUSCULAR | Status: DC | PRN
  Administered 2013-07-14 (×2): 1 mg via INTRAVENOUS
  Filled 2013-07-14: qty 0.5
  Filled 2013-07-14: qty 1

## 2013-07-14 MED ORDER — HYDROMORPHONE 0.3 MG/ML IV SOLN
INTRAVENOUS | Status: DC
  Administered 2013-07-14: 0.2 mg via INTRAVENOUS
  Administered 2013-07-14: 0.6 mg via INTRAVENOUS
  Administered 2013-07-14: 15:00:00 via INTRAVENOUS
  Administered 2013-07-15: 4 mL via INTRAVENOUS
  Administered 2013-07-15: 1.19 mg via INTRAVENOUS
  Administered 2013-07-15: 0.399 mg via INTRAVENOUS
  Filled 2013-07-14: qty 25

## 2013-07-14 MED ORDER — KETOROLAC TROMETHAMINE 30 MG/ML IJ SOLN: 15.0000 mg | Freq: Once | INTRAMUSCULAR | Status: DC | PRN

## 2013-07-14 MED ORDER — NALOXONE HCL 0.4 MG/ML IJ SOLN: 0.4000 mg | INTRAMUSCULAR | Status: DC | PRN

## 2013-07-14 MED ORDER — CEFAZOLIN SODIUM-DEXTROSE 2-3 GM-% IV SOLR
INTRAVENOUS | Status: AC
  Filled 2013-07-14: qty 50

## 2013-07-14 MED ORDER — NEOSTIGMINE METHYLSULFATE 10 MG/10ML IV SOLN
INTRAVENOUS | Status: DC | PRN
  Administered 2013-07-14: 3 mg via INTRAVENOUS

## 2013-07-14 MED ORDER — GLYCOPYRROLATE 0.2 MG/ML IJ SOLN
INTRAMUSCULAR | Status: AC
  Filled 2013-07-14: qty 3

## 2013-07-14 MED ORDER — HYDROMORPHONE HCL PF 1 MG/ML IJ SOLN
0.2500 mg | INTRAMUSCULAR | Status: DC | PRN
  Administered 2013-07-14 (×2): 0.5 mg via INTRAVENOUS

## 2013-07-14 MED ORDER — LORAZEPAM 2 MG/ML IJ SOLN
INTRAMUSCULAR | Status: AC
  Filled 2013-07-14: qty 1

## 2013-07-14 MED ORDER — PROPOFOL 10 MG/ML IV EMUL
INTRAVENOUS | Status: AC
  Filled 2013-07-14: qty 20

## 2013-07-14 MED ORDER — LORAZEPAM 0.5 MG PO TABS
0.5000 mg | ORAL_TABLET | Freq: Four times a day (QID) | ORAL | Status: DC | PRN
  Administered 2013-07-14: 1 mg via ORAL
  Filled 2013-07-14: qty 2

## 2013-07-14 MED ORDER — SODIUM CHLORIDE 0.9 % IV SOLN
INTRAVENOUS | Status: DC | PRN
  Administered 2013-07-14: 11:00:00 via INTRAMUSCULAR

## 2013-07-14 MED ORDER — HYDROMORPHONE HCL PF 1 MG/ML IJ SOLN: 0.2000 mg | INTRAMUSCULAR | Status: DC | PRN

## 2013-07-14 MED ORDER — MIDAZOLAM HCL 2 MG/2ML IJ SOLN
INTRAMUSCULAR | Status: AC
  Filled 2013-07-14: qty 2

## 2013-07-14 MED ORDER — LACTATED RINGERS IV SOLN
INTRAVENOUS | Status: DC
  Administered 2013-07-14 (×2): via INTRAVENOUS

## 2013-07-14 MED ORDER — PROPOFOL 10 MG/ML IV BOLUS
INTRAVENOUS | Status: DC | PRN
  Administered 2013-07-14: 200 mg via INTRAVENOUS

## 2013-07-14 MED ORDER — ONDANSETRON HCL 4 MG/2ML IJ SOLN
INTRAMUSCULAR | Status: DC | PRN
  Administered 2013-07-14 (×2): 2 mg via INTRAVENOUS

## 2013-07-14 MED ORDER — OXYCODONE-ACETAMINOPHEN 5-325 MG PO TABS
1.0000 | ORAL_TABLET | ORAL | Status: DC | PRN
  Administered 2013-07-15 – 2013-07-16 (×5): 2 via ORAL
  Filled 2013-07-14 (×5): qty 2

## 2013-07-14 MED ORDER — ONDANSETRON HCL 4 MG/2ML IJ SOLN
INTRAMUSCULAR | Status: AC
  Filled 2013-07-14: qty 2

## 2013-07-14 MED ORDER — HYDROMORPHONE HCL PF 1 MG/ML IJ SOLN
INTRAMUSCULAR | Status: DC | PRN
  Administered 2013-07-14 (×2): 1 mg via INTRAVENOUS

## 2013-07-14 MED ORDER — GLYCOPYRROLATE 0.2 MG/ML IJ SOLN
INTRAMUSCULAR | Status: DC | PRN
  Administered 2013-07-14: 0.1 mg via INTRAVENOUS
  Administered 2013-07-14: 0.4 mg via INTRAVENOUS
  Administered 2013-07-14: 0.1 mg via INTRAVENOUS

## 2013-07-14 MED ORDER — ROCURONIUM BROMIDE 100 MG/10ML IV SOLN
INTRAVENOUS | Status: DC | PRN
  Administered 2013-07-14: 10 mg via INTRAVENOUS
  Administered 2013-07-14: 50 mg via INTRAVENOUS

## 2013-07-14 MED ORDER — MENTHOL 3 MG MT LOZG
1.0000 | LOZENGE | OROMUCOSAL | Status: DC | PRN
Start: 2013-07-14 — End: 2013-07-16

## 2013-07-14 MED ORDER — FENTANYL CITRATE 0.05 MG/ML IJ SOLN
INTRAMUSCULAR | Status: AC
  Filled 2013-07-14: qty 2

## 2013-07-14 MED ORDER — LACTATED RINGERS IV SOLN
INTRAVENOUS | Status: DC
  Administered 2013-07-14 – 2013-07-15 (×3): via INTRAVENOUS

## 2013-07-14 MED ORDER — NEOSTIGMINE METHYLSULFATE 10 MG/10ML IV SOLN
INTRAVENOUS | Status: AC
  Filled 2013-07-14: qty 1

## 2013-07-14 MED ORDER — PANTOPRAZOLE SODIUM 40 MG PO TBEC
40.0000 mg | DELAYED_RELEASE_TABLET | Freq: Every day | ORAL | Status: DC
  Administered 2013-07-15: 40 mg via ORAL
  Filled 2013-07-14: qty 1

## 2013-07-14 MED ORDER — ONDANSETRON HCL 4 MG PO TABS: 4.0000 mg | ORAL_TABLET | Freq: Four times a day (QID) | ORAL | Status: DC | PRN

## 2013-07-14 MED ORDER — VASOPRESSIN 20 UNIT/ML IJ SOLN
INTRAMUSCULAR | Status: AC
Start: 2013-07-14 — End: 2013-07-14
  Filled 2013-07-14: qty 1

## 2013-07-14 MED ORDER — SODIUM CHLORIDE 0.9 % IJ SOLN
INTRAMUSCULAR | Status: AC
  Filled 2013-07-14: qty 100

## 2013-07-14 MED ORDER — MIDAZOLAM HCL 2 MG/2ML IJ SOLN
INTRAMUSCULAR | Status: DC | PRN
  Administered 2013-07-14: 2 mg via INTRAVENOUS

## 2013-07-14 MED ORDER — LORAZEPAM 2 MG/ML IJ SOLN
INTRAMUSCULAR | Status: AC
  Administered 2013-07-14: 1 mg via INTRAVENOUS
  Filled 2013-07-14: qty 1

## 2013-07-14 MED ORDER — BUPIVACAINE HCL 0.5 % IJ SOLN
INTRAMUSCULAR | Status: DC | PRN
Start: 2013-07-14 — End: 2013-07-14
  Administered 2013-07-14: 30 mL

## 2013-07-14 SURGICAL SUPPLY — 40 items
CANISTER SUCT 3000ML (MISCELLANEOUS) ×4 IMPLANT
CELLS DAT CNTRL 66122 CELL SVR (MISCELLANEOUS) IMPLANT
CHLORAPREP W/TINT 26ML (MISCELLANEOUS) ×4 IMPLANT
CLOTH BEACON ORANGE TIMEOUT ST (SAFETY) ×4 IMPLANT
CONT PATH 16OZ SNAP LID 3702 (MISCELLANEOUS) ×4 IMPLANT
DECANTER SPIKE VIAL GLASS SM (MISCELLANEOUS) ×12 IMPLANT
DRAPE CESAREAN BIRTH W POUCH (DRAPES) ×4 IMPLANT
DRSG OPSITE POSTOP 4X10 (GAUZE/BANDAGES/DRESSINGS) ×4 IMPLANT
GLOVE BIOGEL PI IND STRL 7.0 (GLOVE) ×8 IMPLANT
GLOVE BIOGEL PI INDICATOR 7.0 (GLOVE) ×8
GLOVE ECLIPSE 7.0 STRL STRAW (GLOVE) ×4 IMPLANT
GOWN STRL REUS W/TWL LRG LVL3 (GOWN DISPOSABLE) ×8 IMPLANT
HEMOSTAT SURGICEL 4X8 (HEMOSTASIS) ×4 IMPLANT
NEEDLE HYPO 25X1 1.5 SAFETY (NEEDLE) ×4 IMPLANT
NS IRRIG 1000ML POUR BTL (IV SOLUTION) ×4 IMPLANT
PACK ABDOMINAL GYN (CUSTOM PROCEDURE TRAY) ×4 IMPLANT
PAD ABD 7.5X8 STRL (GAUZE/BANDAGES/DRESSINGS) ×4 IMPLANT
PAD OB MATERNITY 4.3X12.25 (PERSONAL CARE ITEMS) ×4 IMPLANT
RETRACTOR WND ALEXIS 25 LRG (MISCELLANEOUS) IMPLANT
RTRCTR WOUND ALEXIS 18CM MED (MISCELLANEOUS)
RTRCTR WOUND ALEXIS 25CM LRG (MISCELLANEOUS)
SPONGE GAUZE 4X4 12PLY STER LF (GAUZE/BANDAGES/DRESSINGS) ×4 IMPLANT
SPONGE LAP 18X18 X RAY DECT (DISPOSABLE) ×16 IMPLANT
STAPLER VISISTAT 35W (STAPLE) IMPLANT
SUT PDS AB 0 CT1 27 (SUTURE) IMPLANT
SUT VIC AB 0 CT1 18XCR BRD8 (SUTURE) ×4 IMPLANT
SUT VIC AB 0 CT1 27 (SUTURE) ×6
SUT VIC AB 0 CT1 27XBRD ANBCTR (SUTURE) ×6 IMPLANT
SUT VIC AB 0 CT1 8-18 (SUTURE) ×4
SUT VIC AB 1 CTX 36 (SUTURE) ×4
SUT VIC AB 1 CTX36XBRD ANBCTRL (SUTURE) ×4 IMPLANT
SUT VIC AB 2-0 CT1 (SUTURE) IMPLANT
SUT VIC AB 3-0 SH 27 (SUTURE)
SUT VIC AB 3-0 SH 27X BRD (SUTURE) IMPLANT
SUT VICRYL 0 TIES 12 18 (SUTURE) ×4 IMPLANT
SYR CONTROL 10ML LL (SYRINGE) ×8 IMPLANT
TAPE CLOTH SURG 4X10 WHT LF (GAUZE/BANDAGES/DRESSINGS) ×4 IMPLANT
TOWEL OR 17X24 6PK STRL BLUE (TOWEL DISPOSABLE) ×8 IMPLANT
TRAY FOLEY CATH 14FR (SET/KITS/TRAYS/PACK) ×4 IMPLANT
WATER STERILE IRR 1000ML POUR (IV SOLUTION) IMPLANT

## 2013-07-14 NOTE — H&P (View-Only) (Signed)
CLINIC ENCOUNTER NOTE  History:  43 y.o. [id number] here today for preoperative encounter prior to myomectomy scheduled on 07/14/2013. History of previous myomectomy in 2005.   Reports increased urinary frequency, no dysuria. No other GYN concerns.  The following portions of the patient's history were reviewed and updated as appropriate: allergies, current medications, past family history, past medical history, past social history, past surgical history and problem list.  Review of Systems:  Pertinent items are noted in HPI.  Objective:  Physical Exam BP 102/62  Pulse 58  Temp(Src) 97.7 F (36.5 C) (Oral)  Ht 5\' 2"  (1.575 m)  Wt 138 lb 1.6 oz (62.642 kg)  BMI 25.25 kg/m2 Gen: NAD Abd: Soft, nontender and nondistended Pelvic: Deferred  Labs and Imaging 05/22/2013   TRANSABDOMINAL AND TRANSVAGINAL ULTRASOUND OF PELVIS  CLINICAL DATA:  Evaluate fibroids, status post myomectomy TECHNIQUE: Both transabdominal and transvaginal ultrasound examinations of the pelvis were performed. Transabdominal technique was performed for global imaging of the pelvis including uterus, ovaries, adnexal regions, and pelvic cul-de-sac. It was necessary to proceed with endovaginal exam following the transabdominal exam to visualize the endometrium.  COMPARISON:  05/03/2011  FINDINGS: Uterus  Measurements: 12.9 x 8.3 x 8.9 cm. Multiple uterine fibroids, including:  --5.4 x 5.2 x 5.6 cm transmural left fundal fibroid, previously 4.5 x 5.2 x 4.3 cm  --3.9 x 3.9 x 3.1 cm intramural/subserosal left posterior uterine body fibroid  --2.1 x 2.3 x 2.4 cm intramural/submucosal right posterior fundal fibroid  --2.2 x 2.1 x 1.6 cm subserosal right uterine body fibroid  Endometrium  Thickness: 13 mm.  No focal abnormality visualized.  Right ovary  Measurements: 2.7 x 1.4 x 2.1 cm. Normal appearance/no adnexal mass.  Left ovary  Measurements: 3.8 x 2.1 x 3.1 cm. Normal appearance/no adnexal mass.  Other findings  No free fluid.   IMPRESSION: Multiple uterine fibroids, as above.  Dominant 5.6 cm transmural left fundal fibroid is minimally increased from 2013.   Electronically Signed   By: Julian Hy M.D.   On: 05/22/2013 09:55   06/19/2013 UA neg; urine culture sent  Assessment & Plan:   Risks of myomectomy were discussed with the patient including but not limited to: bleeding which may require transfusion or reoperation and hysterectomy; infection which may require antibiotics; injury to bowel, bladder, ureters or other surrounding organs; need for additional procedures; formation of intraperitoneal adhesions that may lead to other complications or make future surgeries more difficult; thromboembolic phenomenon, incisional problems and other postoperative/anesthesia complications. Also discussed possible need for cesarean deliveries at 41 - [redacted] weeks gestation for subsequent pregnancies if the endometrial cavity is breached and possible recurrence of fibroids. Patient verbalized understanding of all these risks and wanted to proceed. Postoperative expectations were also discussed in detail. The patient also understands the alternative treatment options which were discussed in full. All questions were answered. She was told that she will be contacted by our surgical scheduler closer to the time of surgery; routine preoperative instructions of having nothing to eat or drink after midnight on the day prior to surgery and also coming to the hospital 1.5 hours prior to her time of surgery were also emphasized. She was told she may be called for a preoperative appointment about a week prior to surgery and will be given further preoperative instructions at that visit. Printed patient education handouts about the procedure were given to the patient to review at home.  Will follow up urine culture results and manage accordingly.  Verita Schneiders, MD, [place] Attending [place], [facility]

## 2013-07-14 NOTE — Progress Notes (Signed)
Discussed the surgical findings and complications in detail with the patient. Reemphasized that the hysterectomy was necessary to prevent a bleeding diathesis. Emphasized the extent of the adhesive disease and the involvement of the leiomyomata in the who endometrial cavity; resection of the fibroids would have obliterated the endometrial cavity (hence significantly reducing the fertility potential of the uterus) and still resulted in significant bleeding.  Appropriate support was given to her and her family.  Patient is very tearful and upset about the loss of future fertility; appropriate support was given to her about this issue.  Will continue to monitor her closely.  Chaplain services were consulted to help the patient.    Will round on the patient in the morning.   Osborne Oman, MD 07/14/2013 7:01 PM

## 2013-07-14 NOTE — Interval H&P Note (Signed)
History and Physical Interval Note   KEMAYA DORNER  has presented today for surgery with the diagnosis of fibroids and abnormal uterine bleeding.  The various methods of treatment have been discussed with the patient and family. After consideration of risks (including but not limited to possible risk of reoperation or hysterectomy in the event of postoperative significant bleeding, increased risk of adhesive disease making future procedures very difficult, infection, injury to surrounding organs, VTE etc) , benefits and other options for treatment, the patient has consented to [place] as a surgical intervention .  The patient's history has been reviewed, patient examined, no change in status, stable for surgery.  I have reviewed the patient's chart and labs.  Questions were answered to the patient's satisfaction.  To OR when ready.    Osborne Oman, MD 07/14/2013 8:59 AM

## 2013-07-14 NOTE — Transfer of Care (Signed)
Immediate Anesthesia Transfer of Care Note  Patient: Mary Armstrong  Procedure(s) Performed: Procedure(s): MYOMECTOMY (N/A) HYSTERECTOMY SUPRACERVICAL ABDOMINAL (N/A) SALPINGO OOPHORECTOMY (Left) EXTENSIVE LYSIS OF ADHESIONS, ENTERO LYSIS OF ADHESIONS (N/A)  Patient Location: PACU  Anesthesia Type:General  Level of Consciousness: awake, alert  and oriented  Airway & Oxygen Therapy: Patient Spontanous Breathing and Patient connected to nasal cannula oxygen  Post-op Assessment: Report given to PACU RN, Post -op Vital signs reviewed and stable and Patient moving all extremities X 4  Post vital signs: Reviewed and stable  Complications: No apparent anesthesia complications

## 2013-07-14 NOTE — Anesthesia Preprocedure Evaluation (Signed)
Anesthesia Evaluation  Patient identified by MRN, date of birth, ID band Patient awake    Reviewed: Allergy & Precautions, H&P , NPO status , Patient's Chart, lab work & pertinent test results  Airway Mallampati: I TM Distance: >3 FB Neck ROM: full    Dental no notable dental hx. (+) Teeth Intact,    Pulmonary neg pulmonary ROS,    Pulmonary exam normal       Cardiovascular negative cardio ROS      Neuro/Psych negative psych ROS   GI/Hepatic negative GI ROS, Neg liver ROS,   Endo/Other  negative endocrine ROS  Renal/GU negative Renal ROS     Musculoskeletal   Abdominal Normal abdominal exam  (+)   Peds  Hematology   Anesthesia Other Findings   Reproductive/Obstetrics negative OB ROS                           Anesthesia Physical Anesthesia Plan  ASA: II  Anesthesia Plan: General   Post-op Pain Management:    Induction: Intravenous  Airway Management Planned: Oral ETT  Additional Equipment:   Intra-op Plan:   Post-operative Plan: Extubation in OR  Informed Consent: I have reviewed the patients History and Physical, chart, labs and discussed the procedure including the risks, benefits and alternatives for the proposed anesthesia with the patient or authorized representative who has indicated his/her understanding and acceptance.   Dental Advisory Given  Plan Discussed with: CRNA and Surgeon  Anesthesia Plan Comments:         Anesthesia Quick Evaluation

## 2013-07-14 NOTE — Progress Notes (Signed)
Patient has been crying at long intervals since 1900.  Family member at bedside.  Patient using foul language when talking with family member.  Emotional support given.  Offered to called Chaplain, patient declined stated it is nothing we can do for her at this time.

## 2013-07-14 NOTE — Anesthesia Postprocedure Evaluation (Signed)
Anesthesia Post Note  Patient: Mary Armstrong  Procedure(s) Performed: Procedure(s) (LRB): MYOMECTOMY (N/A) HYSTERECTOMY SUPRACERVICAL ABDOMINAL (N/A) SALPINGO OOPHORECTOMY (Left) EXTENSIVE LYSIS OF ADHESIONS, ENTERO LYSIS OF ADHESIONS (N/A)  Anesthesia type: General  Patient location: PACU  Post pain: Pain level controlled  Post assessment: Post-op Vital signs reviewed  Last Vitals:  Filed Vitals:   07/14/13 1345  BP: 132/85  Pulse: 103  Temp:   Resp: 20    Post vital signs: Reviewed  Level of consciousness: sedated  Complications: No apparent anesthesia complications

## 2013-07-14 NOTE — Op Note (Signed)
Mary Armstrong PROCEDURE DATE: 07/14/2013  PREOPERATIVE DIAGNOSIS: Symptomatic uterine leiomyomata, abnormal uterine bleeding, previous myomectomy. POSTOPERATIVE DIAGNOSIS: The same PROCEDURE: Abdominal Myomectomy converted to Supracervical Hysterectomy, Left Salpingoophorectomy, Extensive Enterolysis and Lysis of Adhesions  SURGEON:  Dr. Verita Schneiders ASSISTANT: Dr. Clovia Cuff  INDICATIONS: 43 y.o. [id number] here for abdominal myomectomy for symptomatic uterine leiomyomata.  Risks of surgery were discussed with the patient including but not limited to: bleeding which may require transfusion or hysterectomy or reoperation; infection which may require antibiotics; injury to bowel, bladder, ureters or other surrounding organs; need for additional procedures; formation of intraperitoneal adhesions that may lead to other complications or make future surgeries more difficult;  thromboembolic phenomenon, incisional problems and other postoperative/anesthesia complications.  Also discussed possible need for cesarean deliveries at 39 - [redacted] weeks gestation for subsequent pregnancies if the endometrial cavity is breached and possible recurrence of fibroids. Written informed consent was obtained.    FINDINGS:  Multiple uterine leiomyomata.  About six (1 cm - 4 cm) were able to be removed but there was very significant bleeding. There was extensive adhesive disease involving the intestines and the posterior uterus, the bladder and the anterior uterus, the adnexa and the pelvic side walls. Significant bleeding was encountered during lysis of adhesions; the entire uterine serosal surface continued to have significant bleeding.  Multiple intramural fibroids were noted. The largest fibroid encountered involved over half of the endometrial cavity and this was resected partially.  The bleeding from this defect was very significant. Given the concern about continued bleeding and further destruction of the endometrial cavity and  the entire uterus, the decision was made to proceed with hysterectomy.  The left adnexa was plastered to the uterus due to dense adhesions, this was resected en bloc with the uterus.  Right adnexa was able to be extricated from the adhesions and saved. Due to the extent of the adhesive disease and bleeding, the decision was made to leave the cervix in place.   ANESTHESIA:    General INTRAVENOUS FLUIDS: 2200 ml ESTIMATED BLOOD LOSS:700 ml URINE OUTPUT: 300 ml SPECIMENS: Leiomyomata, uterus, left adnexa sent to pathology COMPLICATIONS: None immediate  PROCEDURE IN DETAIL:    The patient received intravenous antibiotics and had sequential compression devices applied to her lower extremities while in the preoperative area.   She was taken to the operating room and placed under general anesthesia without difficulty.The abdomen and perineum were prepped and draped in a sterile manner, and she was placed in a dorsal supine position.  A Foley catheter was inserted into the bladder and attached to constant drainage. After an adequate timeout was performed, a low tranverse incision was made over her preexisting myomectomy scar.  This incision was carried down to the fascia using electrocautery.  The fascia was incised in the midline and this incision was extended bilaterally using the Mayo scissors.  Kocher clamps were applied to the superior aspect of the fascial incision, and the rectus muscles were unable to be dissected off due to adhesions.  The decision was made to proceed with a modified Mallard incision.  The rectus muscles were separated in the midline bluntly and transected horizontally bilaterally with care given to avoid the epigastric vessels.  The peritoneum was entered sharply.  Attention was then turned to the uterus and significant adhesive disease was noted as mentioned in the findings above.  The uninvolved bowel were packed away with moist laparotomy sponges.  There was extensive sharp and blunt  dissection of the  adhesions of the uterus to the bowel, pelvic side walls and bladder. After extensive adhesion that resulted in about 300 ml of blood loss, the uterus was then able to be delivered up through the incision.  Attention was then turned to the posterior uterine surface where  a 3 cm intramural leiomyoma was identified. Vasopressin solution was injected over the surface of the leiomyoma to aid with hemostasis.   A vertical incision was made over the uterine leiomyoma into the leiomyoma, and the capsule was recognized.  Using blunt methods, the leiomyoma was freed from the surrounding myometrial tissue and removed intact.  Other smaller leiomyomata were removed in similar fashion through this incision.  There was significant bleeding encountered during this process.  The largest fibroid in the left fundal area was approached in the same fashion. Unfortunately, this fibroid had no clear capsule and appeared adenomyomatous.  It also involved over half of the endometrial cavity and this was resected partially.  The bleeding from this defect was very significant.  Total resection of this fibroid would have removed over half of the endometrial cavity and resulted in significant bleeding.  Given the concern about continued severe bleeding and further destruction of the endometrial cavity and the entire uterus, the decision was made to proceed with hysterectomy.  Of note, over one and a half hours had already been devoted to extensive adhesiolysis and myomectomy at this point and the total estimated blood loss was about 500 ml.  The round ligaments on each side were clamped, suture ligated with 0 Vicryl, and transected with electrocautery allowing entry into the broad ligament. Of note, all sutures used in this procedure are 0 Vicryl unless otherwise noted. The ureters were inspected to be safely away from the area of dissection bilaterally.  Adnexae were clamped on the patient's right side, cut, and doubly  suture ligated. This allowed for the right adnexa to remain in place. The right fallopian tube was extensively adherent to the right ovary; salpingectomy was not done given concern about further bleeding and also not wanting to make the patient surgically menopausal as a result of further bleeding which could have resulted in a right salpingooophorectomy. Given the extensive adhesions to the left adnexa, a hole was created in the clear portion of the posterior broad ligament and the infundibulopelvic ligament clamped on the patient's left side. This pedicle was then clamped, cut, and doubly suture ligated with good hemostasis allowing for left salpingoophorectomy.  A bladder flap was then created, which was difficult due to adhesions.  The bladder was then bluntly and sharply dissected off the lower uterine segment with good hemostasis noted. The uterine arteries were then skeletonized bilaterally and then clamped, cut, and doubly suture ligated with care given to prevent ureteral injury.  The uterus was then amputated across the lower uterine segment underneath all remaining lower uterine segment fibroids, leaving the cervix intact. The specimen was sent to pathology. The cervical canal was coagulated, and the anterior and posterior peritoneal edges were then reapproximated in the midline over the cervical stump without complication.  Hemostasis was reconfirmed at all pedicles and along the pelvic sidewall.   All laparotomy sponges and instruments were removed from the abdomen. The fascia was also closed in a running fashion with 0 PDS. The skin was closed with a 4-0 Vicryl subcuticular stitch. Sponge, lap, needle, and instrument counts were correct times two. The patient was taken to the recovery area awake, extubated and in stable condition.    Jaydah Stahle  Harolyn Rutherford, MD, [place] Attending [place], [facility]

## 2013-07-15 ENCOUNTER — Encounter (HOSPITAL_COMMUNITY): Payer: Self-pay | Admitting: Obstetrics & Gynecology

## 2013-07-15 DIAGNOSIS — Z9889 Other specified postprocedural states: Secondary | ICD-10-CM

## 2013-07-15 LAB — CBC
HEMATOCRIT: 25.2 % — AB (ref 36.0–46.0)
Hemoglobin: 8.6 g/dL — ABNORMAL LOW (ref 12.0–15.0)
MCH: 31.6 pg (ref 26.0–34.0)
MCHC: 34.1 g/dL (ref 30.0–36.0)
MCV: 92.6 fL (ref 78.0–100.0)
Platelets: 246 10*3/uL (ref 150–400)
RBC: 2.72 MIL/uL — ABNORMAL LOW (ref 3.87–5.11)
RDW: 14.3 % (ref 11.5–15.5)
WBC: 15.7 10*3/uL — ABNORMAL HIGH (ref 4.0–10.5)

## 2013-07-15 MED ORDER — DOCUSATE SODIUM 100 MG PO CAPS: 100.0000 mg | ORAL_CAPSULE | Freq: Two times a day (BID) | ORAL | Status: DC | PRN

## 2013-07-15 MED ORDER — PROMETHAZINE HCL 25 MG/ML IJ SOLN
25.0000 mg | Freq: Four times a day (QID) | INTRAMUSCULAR | Status: DC | PRN
  Administered 2013-07-15: 25 mg via INTRAVENOUS
  Filled 2013-07-15: qty 1

## 2013-07-15 MED ORDER — FERROUS SULFATE 325 (65 FE) MG PO TABS
325.0000 mg | ORAL_TABLET | Freq: Three times a day (TID) | ORAL | Status: DC
  Administered 2013-07-15 – 2013-07-16 (×3): 325 mg via ORAL
  Filled 2013-07-15 (×3): qty 1

## 2013-07-15 NOTE — Progress Notes (Signed)
Patient is less emotional this am.

## 2013-07-15 NOTE — Progress Notes (Signed)
Ur chart review completed.  

## 2013-07-15 NOTE — Progress Notes (Signed)
1 Day Post-Op Procedure(s) (LRB): MYOMECTOMY (N/A) HYSTERECTOMY SUPRACERVICAL ABDOMINAL (N/A) SALPINGO OOPHORECTOMY (Left) EXTENSIVE LYSIS OF ADHESIONS, ENTEROLYSIS OF ADHESIONS (N/A)  Subjective: Patient reports nausea, vomiting, incisional pain and no problems voiding.  Still very emotional about losing her uterus, talked to the [place] services yesterday.    Objective: I have reviewed patient's vital signs, intake and output, medications and labs. Temp:  [97.8 F (36.6 C)-99.2 F (37.3 C)] 98.4 F (36.9 C) (07/08 0520) Pulse Rate:  [71-150] 85 (07/08 0520) Resp:  [11-39] 16 (07/08 0520) BP: (106-141)/(54-87) 119/79 mmHg (07/08 0520) SpO2:  [97 %-100 %] 100 % (07/08 0520) Weight:  [143 lb 4 oz (64.978 kg)] 143 lb 4 oz (64.978 kg) (07/07 1504)  General: alert and no distress Resp: clear to auscultation bilaterally Cardio: regular rate and rhythm GI: soft, non-tender; rare bowel sounds normal; no masses,  no organomegaly Extremities: extremities normal, atraumatic, no cyanosis or edema and Homans sign is negative, no sign of DVT Vaginal Bleeding: none  CBC Latest Ref Rng 07/15/2013 07/13/2013 08/29/2012  WBC 4.0 - 10.5 K/uL 15.7(H) 6.9 4.8  Hemoglobin 12.0 - 15.0 g/dL 8.6(L) 11.8(L) 10.5(L)  Hematocrit 36.0 - 46.0 % 25.2(L) 35.8(L) 31.8(L)  Platelets 150 - 400 K/uL 246 293 346    Assessment: s/p Procedure(s): MYOMECTOMY (N/A) HYSTERECTOMY SUPRACERVICAL ABDOMINAL (N/A) SALPINGO OOPHORECTOMY (Left) EXTENSIVE LYSIS OF ADHESIONS, ENTERO LYSIS OF ADHESIONS (N/A): mild postoperative ileus as expected.  Plan: Clears as tolerated for now, ADAT Oral iron therapy and Colace for anemia Encourage ambulation Advance to PO medication Continue to give support as she continue to grieve the loss of her fertility   LOS: 1 day    ANYANWU,UGONNA A, MD 07/15/2013, 8:06 AM

## 2013-07-15 NOTE — Progress Notes (Signed)
Mary Armstrong is grieving greatly for the loss of her uterus and she feels regret that she went through with the procedure and anger and sadness at the outcome.  I gave her a space to share some of her feelings and allowed the grief to come in waves.  I reminded her of where she was medically before the procedure with her bleeding and encouraged her to be gentle with herself and not blame herself for wanting to fix that situation.    She is very concerned that she will drop into a deep depression but is very resistant to getting support for that because of her fear that it will be on her "permanent record."  She would not elaborate what she is afraid of with that.  Her boyfriend has not been as supportive as she needs him to be, and she is concerned that he will leave her.  She has also been dealing with other stressors which she did not name.  I provided her with grief support and on a second visit, provided her with a prayer shawl to wrap herself in.  I will also bring her information for heart strings because she is very much viewing this as the loss of the family she wanted to create, since she and her boyfriend had been trying to have a baby for several years.  [employer] Pager, [phone] 3:25 PM   07/15/13 1500  Clinical Encounter Type  Visited With Patient  Visit Type Spiritual support  Referral From Nurse;Physician  Spiritual Encounters  Spiritual Needs Grief support  Stress Factors  Patient Stress Factors (Loss of fertility and womanhood (her words))

## 2013-07-15 NOTE — Anesthesia Postprocedure Evaluation (Signed)
  Anesthesia Post-op Note  Patient: Mary Armstrong  Procedure(s) Performed: Procedure(s): MYOMECTOMY (N/A) HYSTERECTOMY SUPRACERVICAL ABDOMINAL (N/A) SALPINGO OOPHORECTOMY (Left) EXTENSIVE LYSIS OF ADHESIONS, ENTERO LYSIS OF ADHESIONS (N/A)  Patient Location: [facility]  Anesthesia Type:General  Level of Consciousness: awake, alert , oriented and patient cooperative  Airway and Oxygen Therapy: Patient Spontanous Breathing and Patient connected to nasal cannula oxygen  Post-op Pain: mild  Post-op Assessment: Patient's Cardiovascular Status Stable and Respiratory Function Stable SaO2 100% with O2 2L/min  Post-op Vital Signs: stable  Last Vitals:  Filed Vitals:   07/15/13 0520  BP: 119/79  Pulse: 85  Temp: 36.9 C  Resp: 16    Complications: No apparent anesthesia complications

## 2013-07-15 NOTE — Addendum Note (Signed)
Addendum created 07/15/13 0926 by Georgeanne Nim, CRNA   Modules edited: Notes Section   Notes Section:  File: [id number]

## 2013-07-16 MED ORDER — DSS 100 MG PO CAPS: 100.0000 mg | ORAL_CAPSULE | Freq: Two times a day (BID) | ORAL | Status: DC | PRN

## 2013-07-16 MED ORDER — ALPRAZOLAM 1 MG PO TABS: 0.5000 mg | ORAL_TABLET | Freq: Three times a day (TID) | ORAL | Status: DC | PRN

## 2013-07-16 MED ORDER — FERROUS SULFATE 325 (65 FE) MG PO TABS: 325.0000 mg | ORAL_TABLET | Freq: Two times a day (BID) | ORAL | Status: DC

## 2013-07-16 MED ORDER — IBUPROFEN 600 MG PO TABS: 600.0000 mg | ORAL_TABLET | Freq: Four times a day (QID) | ORAL | Status: DC | PRN

## 2013-07-16 MED ORDER — VALACYCLOVIR HCL 500 MG PO TABS: 500.0000 mg | ORAL_TABLET | Freq: Two times a day (BID) | ORAL | Status: DC

## 2013-07-16 MED ORDER — OXYCODONE-ACETAMINOPHEN 5-325 MG PO TABS: 1.0000 | ORAL_TABLET | ORAL | Status: DC | PRN

## 2013-07-16 MED ORDER — PROMETHAZINE HCL 25 MG PO TABS: 25.0000 mg | ORAL_TABLET | Freq: Four times a day (QID) | ORAL | Status: DC | PRN

## 2013-07-16 NOTE — Progress Notes (Signed)
Out in wheelchair teaching complete 

## 2013-07-16 NOTE — Discharge Instructions (Signed)
Abdominal Hysterectomy, Care After Refer to this sheet in the next few weeks. These instructions provide you with information on caring for yourself after your procedure. Your health care provider may also give you more specific instructions. Your treatment has been planned according to current medical practices, but problems sometimes occur. Call your health care provider if you have any problems or questions after your procedure.  WHAT TO EXPECT AFTER THE PROCEDURE After your procedure, it is typical to have the following:  Pain.  Feeling tired.  Poor appetite.  Less interest in sex. HOME CARE INSTRUCTIONS  It takes 4-6 weeks to recover from this surgery. Make sure you follow all your health care provider's instructions. Home care instructions may include:  Take pain medicines only as directed by your health care provider. Do not take over-the-counter pain medicines without checking with your health care provider first.  Change your bandage as directed by your health care provider.  Return to your health care provider to have your sutures taken out.  Take showers instead of baths for 2-3 weeks. Ask your health care provider when it is safe to start showering.  Do not douche, use tampons, or have sexual intercourse for at least 6 weeks or until your health care provider says you can.   Follow your health care provider's advice about exercise, lifting, driving, and general activities.  Get plenty of rest and sleep.   Do not lift anything heavier than a gallon of milk (about 10 lb [4.5 kg]) for the first month after surgery.  You can resume your normal diet if your health care provider says it is okay.   Do not drink alcohol until your health care provider says you can.   If you are constipated, ask your health care provider if you can take a mild laxative.  Eating foods high in fiber may also help with constipation. Eat plenty of raw fruits and vegetables, whole grains, and  beans.  Drink enough fluids to keep your urine clear or pale yellow.   Try to have someone at home with you for the first 1-2 weeks to help around the house.  Keep all follow-up appointments. SEEK MEDICAL CARE IF:   You have chills or fever.  You have swelling, redness, or pain in the area of your incision that is getting worse.   You have pus coming from the incision.   You notice a bad smell coming from the incision or bandage.   Your incision breaks open.   You feel dizzy or light-headed.   You have pain or bleeding when you urinate.   You have persistent diarrhea.   You have persistent nausea and vomiting.   You have abnormal vaginal discharge.   You have a rash.   You have any type of abnormal reaction or develop an allergy to your medicine.   Your pain medicine is not helping.  SEEK IMMEDIATE MEDICAL CARE IF:   You have a fever and your symptoms suddenly get worse.  You have severe abdominal pain.  You have chest pain.  You have shortness of breath.  You faint.  You have pain, swelling, or redness of your leg.  You have heavy vaginal bleeding with blood clots. MAKE SURE YOU:  Understand these instructions.  Will watch your condition.  Will get help right away if you are not doing well or get worse. Document Released: 07/14/2004 Document Revised: 12/30/2012 Document Reviewed: 10/17/2012 [employer] Patient Information 2015 [employer]. This information is not intended   to replace advice given to you by your health care provider. Make sure you discuss any questions you have with your health care provider.  

## 2013-07-16 NOTE — Progress Notes (Signed)
07/16/13 1145  Clinical Encounter Type  Visited With Patient  Visit Type Spiritual support;Social support  Referral From Chaplain ([address] [place], [place], [place])  Spiritual Encounters  Spiritual Needs Grief support;Emotional;Prayer  Stress Factors  Patient Stress Factors (loss of fertility and hope for the family she longed for)   Followed up with Levada Dy this morning to offer further support through her grief at the loss of her fertility and the end of her hope for the family she had envisioned.  Provided pastoral presence, reflective listening, grief education, encouragement, and prayer.  Lilliemae expressed gratitude through words and tears. She is aware of ongoing chaplain availability for f/u support if desired.  [place], [place]

## 2013-07-17 NOTE — Discharge Summary (Signed)
Gynecology Physician Discharge Summary  Patient ID: Mary Armstrong MRN: [id number] DOB/AGE: Feb 18, 1970 43 y.o.  Admit date: 07/14/2013 Discharge date: 07/16/2013  Preoperative Diagnoses: Symptomatic uterine leiomyomata, abnormal uterine bleeding, previous myomectomy.  Procedures: Procedure(s) (LRB): MYOMECTOMY (N/A) HYSTERECTOMY SUPRACERVICAL ABDOMINAL (N/A) SALPINGO OOPHORECTOMY (Left) EXTENSIVE LYSIS OF ADHESIONS, ENTERO LYSIS OF ADHESIONS (N/A)  CBC Latest Ref Rng 07/15/2013 07/13/2013 08/29/2012  WBC 4.0 - 10.5 K/uL 15.7(H) 6.9 4.8  Hemoglobin 12.0 - 15.0 g/dL 8.6(L) 11.8(L) 10.5(L)  Hematocrit 36.0 - 46.0 % 25.2(L) 35.8(L) 31.8(L)  Platelets 150 - 400 K/uL [phone]    Hospital Course:  Mary Armstrong is a 43 y.o. [id number]  admitted for scheduled abdominal myomectomy. Unfortunately, the procedure was converted to supracervical abdominal hysterectomy secondary to bleeding and other intraoperative findings.  For further details about surgery, please refer to the operative report.  After the surgery, patient was very emotional about the loss of her uterus and fertility and had to be administered sedative medications several times.  She also had spiritual care meetings with the chaplains and case managers a few times, and a lot of support was given to her and her family.  From a physical standpoint, there were no immediate postoperative complications.  By time of discharge on POD#2, her pain was controlled on oral pain medications; she was ambulating, voiding without difficulty, tolerating regular diet and passing flatus. She was also more understanding and accepting about what happened during her procedure.  She declined any referral to counselors or mental health providers; also declined antidepressant medications.  She did request antianxiety medications; Xanax was prescribed and she was cautioned about dependence potential. She was deemed stable for discharge to home.   Discharge Exam: Blood  pressure 99/60, pulse 79, temperature 98.4 F (36.9 C), temperature source Oral, resp. rate 17, height 5\' 2"  (1.575 m), weight 143 lb 4 oz (64.978 kg), SpO2 99.00%. General appearance: alert and no distress  Resp: clear to auscultation bilaterally  Cardio: RRR  GI: soft, non-tender; bowel sounds normal; no masses, no organomegaly.  Incisions: C/D/I, no erythema, no drainage noted Pelvic: no blood on pad  Extremities: extremities normal, atraumatic, no cyanosis or edema and Homans sign is negative, no sign of DVT  Discharged Condition: Stable  Disposition: 01-Home or Self Care     Medication List    STOP taking these medications       megestrol 20 MG tablet  Commonly known as:  MEGACE      TAKE these medications       ALPRAZolam 1 MG tablet  Commonly known as:  XANAX  Take 0.5-1 tablets (0.5-1 mg total) by mouth 3 (three) times daily as needed for anxiety or sleep.     DSS 100 MG Caps  Take 100 mg by mouth 2 (two) times daily as needed for mild constipation.     ferrous sulfate 325 (65 FE) MG tablet  Take 1 tablet (325 mg total) by mouth 2 (two) times daily with a meal.     ibuprofen 600 MG tablet  Commonly known as:  ADVIL,MOTRIN  Take 1 tablet (600 mg total) by mouth every 6 (six) hours as needed for mild pain, moderate pain or cramping (mild pain).     oxyCODONE-acetaminophen 5-325 MG per tablet  Commonly known as:  PERCOCET/ROXICET  Take 1-2 tablets by mouth every 4 (four) hours as needed for severe pain (moderate to severe pain (when tolerating fluids)).     promethazine 25 MG tablet  Commonly  known as:  PHENERGAN  Take 1 tablet (25 mg total) by mouth every 6 (six) hours as needed for nausea or vomiting.     valACYclovir 500 MG tablet  Commonly known as:  VALTREX  Take 500 mg by mouth 2 (two) times daily as needed.     valACYclovir 500 MG tablet  Commonly known as:  VALTREX  Take 1 tablet (500 mg total) by mouth 2 (two) times daily.           Follow-up  Information   Follow up with [facility] A, MD On 08/03/2013. (1:30 pm for postoperative appointment. Call clinic/come to MAU for any concerning issues)    Specialty:  Obstetrics and Gynecology   Contact information:   [place] [address] [phone]       Signed:  Verita Schneiders, MD, [place] Attending [place], [facility]

## 2013-07-18 ENCOUNTER — Inpatient Hospital Stay (HOSPITAL_COMMUNITY)
Admission: AD | Admit: 2013-07-18 | Discharge: 2013-07-19 | Disposition: A | Discharge status: Home / Self Care | Payer: Self-pay | Source: Ambulatory Visit | Attending: Obstetrics and Gynecology | Admitting: Obstetrics and Gynecology

## 2013-07-18 ENCOUNTER — Inpatient Hospital Stay (HOSPITAL_COMMUNITY): Payer: Self-pay

## 2013-07-18 ENCOUNTER — Encounter (HOSPITAL_COMMUNITY): Payer: Self-pay | Admitting: General Practice

## 2013-07-18 DIAGNOSIS — K5901 Slow transit constipation: Secondary | ICD-10-CM

## 2013-07-18 DIAGNOSIS — R61 Generalized hyperhidrosis: Secondary | ICD-10-CM | POA: Insufficient documentation

## 2013-07-18 DIAGNOSIS — F411 Generalized anxiety disorder: Secondary | ICD-10-CM | POA: Insufficient documentation

## 2013-07-18 DIAGNOSIS — Z90710 Acquired absence of both cervix and uterus: Secondary | ICD-10-CM | POA: Insufficient documentation

## 2013-07-18 DIAGNOSIS — R109 Unspecified abdominal pain: Secondary | ICD-10-CM | POA: Insufficient documentation

## 2013-07-18 DIAGNOSIS — K5900 Constipation, unspecified: Secondary | ICD-10-CM | POA: Insufficient documentation

## 2013-07-18 DIAGNOSIS — A5901 Trichomonal vulvovaginitis: Secondary | ICD-10-CM | POA: Insufficient documentation

## 2013-07-18 DIAGNOSIS — Z8249 Family history of ischemic heart disease and other diseases of the circulatory system: Secondary | ICD-10-CM | POA: Insufficient documentation

## 2013-07-18 LAB — COMPREHENSIVE METABOLIC PANEL
ALT: 16 U/L (ref 0–35)
ANION GAP: 17 — AB (ref 5–15)
AST: 19 U/L (ref 0–37)
Albumin: 3.9 g/dL (ref 3.5–5.2)
Alkaline Phosphatase: 53 U/L (ref 39–117)
BUN: 13 mg/dL (ref 6–23)
CALCIUM: 9.8 mg/dL (ref 8.4–10.5)
CO2: 20 mEq/L (ref 19–32)
Chloride: 102 mEq/L (ref 96–112)
Creatinine, Ser: 0.71 mg/dL (ref 0.50–1.10)
GFR calc non Af Amer: >90 mL/min (ref >90)
GLUCOSE: 101 mg/dL — AB (ref 70–99)
Potassium: 3.7 mEq/L (ref 3.7–5.3)
SODIUM: 139 meq/L (ref 137–147)
TOTAL PROTEIN: 7 g/dL (ref 6.0–8.3)
Total Bilirubin: 0.3 mg/dL (ref 0.3–1.2)

## 2013-07-18 LAB — WET PREP, GENITAL
Clue Cells Wet Prep HPF POC: NONE SEEN
YEAST WET PREP: NONE SEEN

## 2013-07-18 LAB — CBC WITH DIFFERENTIAL/PLATELET
Basophils Absolute: 0 10*3/uL (ref 0.0–0.1)
Basophils Relative: 0 % (ref 0–1)
EOS PCT: 1 % (ref 0–5)
Eosinophils Absolute: 0.1 10*3/uL (ref 0.0–0.7)
HCT: 29.7 % — ABNORMAL LOW (ref 36.0–46.0)
Hemoglobin: 10.3 g/dL — ABNORMAL LOW (ref 12.0–15.0)
LYMPHS ABS: 2.6 10*3/uL (ref 0.7–4.0)
Lymphocytes Relative: 24 % (ref 12–46)
MCH: 31.7 pg (ref 26.0–34.0)
MCHC: 34.7 g/dL (ref 30.0–36.0)
MCV: 91.4 fL (ref 78.0–100.0)
Monocytes Absolute: 0.7 10*3/uL (ref 0.1–1.0)
Monocytes Relative: 6 % (ref 3–12)
Neutro Abs: 7.6 10*3/uL (ref 1.7–7.7)
Neutrophils Relative %: 69 % (ref 43–77)
PLATELETS: 365 10*3/uL (ref 150–400)
RBC: 3.25 MIL/uL — ABNORMAL LOW (ref 3.87–5.11)
RDW: 14.2 % (ref 11.5–15.5)
WBC: 11.1 10*3/uL — ABNORMAL HIGH (ref 4.0–10.5)

## 2013-07-18 MED ORDER — SODIUM CHLORIDE 0.9 % IV SOLN
INTRAVENOUS | Status: DC
  Administered 2013-07-18: 21:00:00 via INTRAVENOUS

## 2013-07-18 MED ORDER — IOHEXOL 300 MG/ML  SOLN
100.0000 mL | Freq: Once | INTRAMUSCULAR | Status: AC | PRN
  Administered 2013-07-18: 100 mL via INTRAVENOUS

## 2013-07-18 MED ORDER — IOHEXOL 300 MG/ML  SOLN
50.0000 mL | INTRAMUSCULAR | Status: AC
  Administered 2013-07-18: 1 mL via ORAL

## 2013-07-18 MED ORDER — POLYETHYLENE GLYCOL 3350 17 GM/SCOOP PO POWD: 17.0000 g | Freq: Every day | ORAL | Status: DC

## 2013-07-18 MED ORDER — DEXTROSE 5 % IV SOLN
1.0000 g | Freq: Once | INTRAVENOUS | Status: AC
  Administered 2013-07-18: 1 g via INTRAVENOUS
  Filled 2013-07-18: qty 10

## 2013-07-18 MED ORDER — METRONIDAZOLE 500 MG PO TABS: 500.0000 mg | ORAL_TABLET | Freq: Two times a day (BID) | ORAL | Status: AC

## 2013-07-18 NOTE — Discharge Instructions (Signed)
tricTrichomoniasis Trichomoniasis is an infection caused by an organism called Trichomonas. The infection can affect both women and men. In women, the outer female genitalia and the vagina are affected. In men, the penis is mainly affected, but the prostate and other reproductive organs can also be involved. Trichomoniasis is a sexually transmitted infection (STI) and is most often passed to another person through sexual contact.  RISK FACTORS  Having unprotected sexual intercourse.  Having sexual intercourse with an infected partner. SIGNS AND SYMPTOMS  Symptoms of trichomoniasis in women include:  Abnormal gray-green frothy vaginal discharge.  Itching and irritation of the vagina.  Itching and irritation of the area outside the vagina. Symptoms of trichomoniasis in men include:   Penile discharge with or without pain.  Pain during urination. This results from inflammation of the urethra. DIAGNOSIS  Trichomoniasis may be found during a Pap test or physical exam. Your health care provider may use one of the following methods to help diagnose this infection:  Examining vaginal discharge under a microscope. For men, urethral discharge would be examined.  Testing the pH of the vagina with a test tape.  Using a vaginal swab test that checks for the Trichomonas organism. A test is available that provides results within a few minutes.  Doing a culture test for the organism. This is not usually needed. TREATMENT   You may be given medicine to fight the infection. Women should inform their health care provider if they could be or are pregnant. Some medicines used to treat the infection should not be taken during pregnancy.  Your health care provider may recommend over-the-counter medicines or creams to decrease itching or irritation.  Your sexual partner will need to be treated if infected. HOME CARE INSTRUCTIONS   Take all medicine prescribed by your health care provider.  Take  over-the-counter medicine for itching or irritation as directed by your health care provider.  Do not have sexual intercourse while you have the infection.  Women should not douche or wear tampons while they have the infection.  Discuss your infection with your partner. Your partner may have gotten the infection from you, or you may have gotten it from your partner.  Have your sex partner get examined and treated if necessary.  Practice safe, informed, and protected sex.  See your health care provider for other STI testing. SEEK MEDICAL CARE IF:   You still have symptoms after you finish your medicine.  You develop abdominal pain.  You have pain when you urinate.  You have bleeding after sexual intercourse.  You develop a rash.  Your medicine makes you sick or makes you throw up (vomit). Document Released: 06/20/2000 Document Revised: 12/30/2012 Document Reviewed: 10/06/2012 [facility] Patient Information 2015 [place], [place]. This information is not intended to replace advice given to you by your health care provider. Make sure you discuss any questions you have with your health care provider.

## 2013-07-18 NOTE — MAU Provider Note (Signed)
History     CSN: [id number]  Arrival date and time: 07/18/13 2007   First Provider Initiated Contact with Patient 07/18/13 2020      Chief Complaint  Patient presents with  . Post-op Problem  . Abdominal Pain   HPI diaphoresis, malaise , low grade fever 4 days s/p abdominal supracervical hysterectomy and unilateral LEFT salpingoophorectomy for extensive uterine myomas and bleeding, after planned multiple myomectomy. Pt has had a relatively uneventful postop course since discharge until today , when LLQ discomfort and inability to defecate is significant, and now accompanied by sweating , diaphoresis, and anxiety. Pt stated that the LLq pain noted earlier has resolved and she has no current pain.  Pertinent Gynecological History: Menses:  Bleeding: none Contraception: status post hysterectomy DES exposure: unknown Blood transfusions: none Sexually transmitted diseases: no past history Previous GYN Procedures: prior myomectomy, now recent abdominal supracervical hyst with unilateral S&O on 07/14/13  LSO Last mammogram: Date:  Last pap: normal Date: 05/11/11   Past Medical History  Diagnosis Date  . Menorrhagia 04/19/2011  . Genital herpes   . Stroke     "light stroke" age 86 or 24yrs  . Anemia     Past Surgical History  Procedure Laterality Date  . Myomectomy  2005  . Myomectomy N/A 07/14/2013    Procedure: MYOMECTOMY;  Surgeon: Osborne Oman, MD;  Location: [facility];  Service: Gynecology;  Laterality: N/A;  . Supracervical abdominal hysterectomy N/A 07/14/2013    Procedure: HYSTERECTOMY SUPRACERVICAL ABDOMINAL;  Surgeon: Osborne Oman, MD;  Location: [facility];  Service: Gynecology;  Laterality: N/A;  . Salpingoophorectomy Left 07/14/2013    Procedure: SALPINGO OOPHORECTOMY;  Surgeon: Osborne Oman, MD;  Location: [facility];  Service: Gynecology;  Laterality: Left;  . Lysis of adhesion N/A 07/14/2013    Procedure: EXTENSIVE LYSIS OF ADHESIONS, ENTERO LYSIS OF ADHESIONS;  Surgeon:  Osborne Oman, MD;  Location: [facility];  Service: Gynecology;  Laterality: N/A;  . Abdominal hysterectomy      Family History  Problem Relation Age of Onset  . Hypertension Mother     Living, 57  . Thyroid disease Mother   . Healthy Brother     Living, 42  . Breast cancer Maternal Grandmother     Died, 68  . Heart disease Maternal Grandmother     History  Substance Use Topics  . Smoking status: Never Smoker   . Smokeless tobacco: Never Used  . Alcohol Use: Yes     Comment: social use    Allergies: No Known Allergies  Prescriptions prior to admission  Medication Sig Dispense Refill  . ALPRAZolam (XANAX) 1 MG tablet Take 0.5-1 tablets (0.5-1 mg total) by mouth 3 (three) times daily as needed for anxiety or sleep.  30 tablet  1  . docusate sodium 100 MG CAPS Take 100 mg by mouth 2 (two) times daily as needed for mild constipation.  30 capsule  3  . ferrous sulfate 325 (65 FE) MG tablet Take 1 tablet (325 mg total) by mouth 2 (two) times daily with a meal.  60 tablet  3  . ibuprofen (ADVIL,MOTRIN) 600 MG tablet Take 1 tablet (600 mg total) by mouth every 6 (six) hours as needed for mild pain, moderate pain or cramping (mild pain).  60 tablet  2  . oxyCODONE-acetaminophen (PERCOCET/ROXICET) 5-325 MG per tablet Take 1-2 tablets by mouth every 4 (four) hours as needed for severe pain (moderate to severe pain (when tolerating fluids)).  [place]  tablet  0  . promethazine (PHENERGAN) 25 MG tablet Take 1 tablet (25 mg total) by mouth every 6 (six) hours as needed for nausea or vomiting.  30 tablet  2  . valACYclovir (VALTREX) 500 MG tablet Take 500 mg by mouth 2 (two) times daily as needed.      . valACYclovir (VALTREX) 500 MG tablet Take 1 tablet (500 mg total) by mouth 2 (two) times daily.  10 tablet  3    ROS Physical Exam   Blood pressure 140/90, pulse 118, temperature 100 F (37.8 C), temperature source Oral, resp. rate 20, last menstrual period 06/03/2013, SpO2 100.00%.  Physical  Exam Physical Examination: General appearance - oriented to person, place, and time, anxious, in mild to moderate distress and diaphoretic Mental status - alert, oriented to person, place, and time, anxious, agitated, appears frightened, was described in recent hospitalization as emotionally distressed by the need for hysterectomy instead of myomectomy Eyes - pupils equal and reactive, extraocular eye movements intact Ears -  Mouth - mucous membranes moist, pharynx normal without lesions Chest - clear to auscultation, no wheezes, rales or rhonchi, symmetric air entry Heart - tachcardia, pulse 118 , was 160 at initial EMS assessment Abdomen - soft, nontender, nondistended, no masses or organomegaly scars from previous incisions without erythema, Bowel sounds active , normal pitch No rebound Pelvic - VULVA: normal appearing vulva with no masses, tenderness or lesions , VAGINA: normal appearing vagina with normal color and discharge, no lesions, vaginal discharge - frothy, milky and thick and after bimanual has a small amount of blood in the d/c , WET MOUNT done - results: trichomonads, DNA probe for chlamydia and GC obtained,  Bimanual shows s marked constipation of hard stool, to anus with what feels like bowel in the area of the cul de sac behind a small cervix Skin - normal coloration and turgor, no rashes, no suspicious skin lesions noted Diaphoretic, moist , sweating , tearfully anxious.  CT-scan of abdomen and pelvis IMPRESSION:  Long segmental rectosigmoid colonic wall thickening which may  indicate colitis. No surrounding fluid collection to suggest abscess  or evidence for bowel perforation or obstruction.  No acute intra-abdominal or pelvic pathology to suggest specific  postoperative complication. Expected recent postsurgical change  within the pelvis and anterior overlying abdominal wall.     MAU Course  Procedures  MDM Serial exam, review of labs and xrays  Assessment  and Plan  Postop marked constipation,  Diaphoresis Vaginal discharge Trichomonas Vaginitis Plan Await labs, Ct abd&pelvis ordered.          Soap suds enema now before CT.  followup exam shows dramatic improvement of comfort and resolution of diaphoresis after BM. Ct normal Plan: d/c home,          Add Miralax      Metronidazole 500 bid x 7days. Partner to get treated.        Routine postop care. Mary Armstrong 07/18/2013, 8:31 PM

## 2013-07-18 NOTE — MAU Note (Signed)
Pt arrived via EMS-post total hysterectomy July 7th. Pain started today. States she has not had a BM since her surgery. Tried to go today and then the pain started today. Denies N/V. Pt is currently cool and clammy.

## 2013-07-20 LAB — GC/CHLAMYDIA PROBE AMP
CT Probe RNA: NEGATIVE
GC Probe RNA: NEGATIVE

## 2013-07-25 LAB — CULTURE, BLOOD (ROUTINE X 2)
Culture: NO GROWTH
Culture: NO GROWTH

## 2013-08-03 ENCOUNTER — Ambulatory Visit (INDEPENDENT_AMBULATORY_CARE_PROVIDER_SITE_OTHER): Payer: Self-pay | Admitting: Obstetrics & Gynecology

## 2013-08-03 ENCOUNTER — Encounter: Payer: Self-pay | Admitting: Obstetrics & Gynecology

## 2013-08-03 VITALS — BP 106/81 | HR 71 | Temp 98.4°F | Ht 62.5 in | Wt 132.0 lb

## 2013-08-03 DIAGNOSIS — Z90710 Acquired absence of both cervix and uterus: Secondary | ICD-10-CM

## 2013-08-03 DIAGNOSIS — N898 Other specified noninflammatory disorders of vagina: Secondary | ICD-10-CM

## 2013-08-03 DIAGNOSIS — Z09 Encounter for follow-up examination after completed treatment for conditions other than malignant neoplasm: Secondary | ICD-10-CM

## 2013-08-03 DIAGNOSIS — G4700 Insomnia, unspecified: Secondary | ICD-10-CM

## 2013-08-03 MED ORDER — TRAZODONE HCL 50 MG PO TABS: 50.0000 mg | ORAL_TABLET | Freq: Every day | ORAL | Status: DC

## 2013-08-03 NOTE — Patient Instructions (Signed)
Benadryl Unisom Melatonin

## 2013-08-03 NOTE — Progress Notes (Signed)
CLINIC ENCOUNTER NOTE  History:  43 y.o. [id number] here today for s/p Abdominal Myomectomy converted to Supracervical Hysterectomy, Left Salpingoophorectomy, Extensive Enterolysis and Lysis of Adhesions on 07/14/13.  She is still having a very hard time dealing with the loss of fertility and reports that she cannot sleep and is very depressed. She declines being referred to mental health specialist, does not want antidepressants for now. Just wants something to help her sleep. She is accompanied by her mother.  Reports that she broke up with her boyfriend.  She was seen in the MAU on 07/18/13 for pain and constipation, had unremarkable CT scan and was treated medically. Since then, the symptoms have improved.  On that visit, she was diagnosed with trichomonas and she reports still having the yellow discharge after taking the Metronidazole course; wants to be retested. Negative GC/Chlam on 07/18/13   The following portions of the patient's history were reviewed and updated as appropriate: allergies, current medications, past family history, past medical history, past social history, past surgical history and problem list. Normal pap in 05/11/11, normal mammogram in 12/09/12.  Review of Systems:  Pertinent items are noted in HPI.  Objective:  Physical Exam BP 106/81  Pulse 71  Temp(Src) 98.4 F (36.9 C) (Oral)  Ht 5' 2.5" (1.588 m)  Wt 132 lb (59.875 kg)  BMI 23.74 kg/m2  LMP 06/03/2013 Gen: NAD Abd: Soft, nontender and nondistended. Well-healed Pfannenstiel incision Pelvic: Normal appearing external genitalia; normal appearing vaginal mucosa and cervix.  Thin, yellow discharge seen, wet prep sample obtained.    Labs and Imaging 07/14/13 Surgical Pathology  Uterus, with left ovary and fallopian tube - UTERUS: -ENDOMETRIUM: WEAKLY PROLIFERATIVE ENDOMETRIUM. NO HYPERPLASIA OR MALIGNANCY. -MYOMETRIUM: LEIOMYOMATA. ADENOMYOSIS. NO MALIGNANCY. -SEROSA: FIBROUS ADHESIONS. NO MALIGNANCY. - CERVIX:  BENIGN ENDOCERVICAL MUCOSA. - LEFT OVARY: HEMORRHAGIC CORPUS LUTEUM. FIBROUS ADHESIONS. NO MALIGNANCY. - BILATERAL FALLOPIAN TUBE: BENIGN FALLOPIAN TUBES. LEFT PARATUBAL CYST. NO MALIGNANCY.  Ct Abdomen Pelvis W Contrast  07/18/2013   CT ABDOMEN AND PELVIS WITH CONTRAST CLINICAL DATA:  Abdominal pain. Total hysterectomy 4 days ago 07/14/2013.  FINDINGS: 3 mm pleural parenchymal nodule left lung base image 11 is incidentally noted, likely of no clinical consequence. Liver, gallbladder, adrenal glands, kidneys, spleen, and pancreas appear normal. Splenule incidentally noted left upper quadrant image 26. No free air, lymphadenopathy, or free fluid.  No bowel dilatation identified. Normal appendix, image 58. There is long segmental rectosigmoid wall thickening measuring 5 mm for example image 63. Postsurgical change status post hysterectomy noted in expected location of low transverse abdominal incision, with subcutaneous stranding and a few foci of gas subjacent to the anterior abdominal wall and within expected location of surgical incision. There is mild fusiform prominence of the rectus muscle in this area but no drainable fluid collection or evidence for hematoma per se. Right ovary is normal in appearance. Uterus and left ovary surgically absent. Trace pelvic fluid is identified with stranding tracking along the left pelvic sidewall.  No acute osseus abnormality.  IMPRESSION: Long segmental rectosigmoid colonic wall thickening which may indicate colitis. No surrounding fluid collection to suggest abscess or evidence for bowel perforation or obstruction.  No acute intra-abdominal or pelvic pathology to suggest specific postoperative complication. Expected recent postsurgical change within the pelvis and anterior overlying abdominal wall.   Electronically Signed   By: Conchita Paris M.D.   On: 07/18/2013 23:22    Assessment & Plan:  Normal physical postoperative exam; but concerning mental health state for  patient  Trazodone 50 mg po qhs prescribed for insomnia, patient informed that is an atypical antidepressant also.  She was told she can increase to 100 mg if no effect in a few days. She was told to call for worsening depression symptoms; may need prescription of SSRI +/- referral to mental health provider. Will follow up wet prep. If persistent Trichomonas, will prescribe Meronidazole 2 g po x 1 dose. Routine preventative health maintenance measures emphasized, patient is aware she will continue to require cervical cancer screening.    Verita Schneiders, MD, [place] Attending [place] for [employer], [place]

## 2013-08-04 ENCOUNTER — Telehealth: Payer: Self-pay | Admitting: *Deleted

## 2013-08-04 ENCOUNTER — Other Ambulatory Visit: Payer: Self-pay | Admitting: Obstetrics & Gynecology

## 2013-08-04 DIAGNOSIS — N760 Acute vaginitis: Principal | ICD-10-CM

## 2013-08-04 DIAGNOSIS — B9689 Other specified bacterial agents as the cause of diseases classified elsewhere: Secondary | ICD-10-CM

## 2013-08-04 LAB — WET PREP, GENITAL
Trich, Wet Prep: NONE SEEN
WBC, Wet Prep HPF POC: NONE SEEN
YEAST WET PREP: NONE SEEN

## 2013-08-04 MED ORDER — METRONIDAZOLE 500 MG PO TABS: 500.0000 mg | ORAL_TABLET | Freq: Two times a day (BID) | ORAL | Status: AC

## 2013-08-04 NOTE — Telephone Encounter (Signed)
Pt called clinic stating that she is seeing spotting since her hysterectomy/pain in not relieved with Ibuprofen.  Contacted patient, evaluated concerns, informed patient spotting is normal after pelvic exam.  Uncontrolled pain not relieved with Ibuprofen may need to be evaluated in the MAU.  Encourage patient to ambulate and continue pain medication.  Pt verbalizes understanding.

## 2013-09-05 ENCOUNTER — Telehealth: Payer: Self-pay | Admitting: Internal Medicine

## 2013-09-05 NOTE — Telephone Encounter (Signed)
Heather  Please call pt and remind her that she was due for a 6 month breast ultrasound of both breasts.  She should have received a reminder letter from radiology.    She did have surgery this summer and may have forgotten about this.    Route to me and I can order for her -  She needs 30 min office visit with me after ultrasound is done   Route back with her response thanks

## 2013-09-07 NOTE — Telephone Encounter (Signed)
Spoke with Mary Armstrong.  She has not received a letter, but she will call and make appointment with them.  Then call us back to follow up with you.

## 2013-11-09 ENCOUNTER — Encounter: Payer: Self-pay | Admitting: Obstetrics & Gynecology

## 2013-11-15 ENCOUNTER — Telehealth: Payer: Self-pay | Admitting: Internal Medicine

## 2013-11-15 NOTE — Telephone Encounter (Signed)
Mary Armstrong  Call pt and ask if she has had her diagnostic mammogram that was due back in summer.  Pt stated she would schedule but I have not received report  Route back her response   Thanks

## 2013-11-23 ENCOUNTER — Encounter: Payer: Self-pay | Admitting: Internal Medicine

## 2013-11-23 NOTE — Telephone Encounter (Signed)
Dr. Coralyn Mark, I left 3 voice messages for Anglea. A voice message was left for Danniella on 11/16/13, 11/18/13 and 11/20/13 asking her to return my call in regards to her tests. She has not yet returned my calls. -eh

## 2013-11-23 NOTE — Telephone Encounter (Signed)
Certified letter will be sent

## 2013-11-25 ENCOUNTER — Telehealth: Payer: Self-pay

## 2013-11-25 NOTE — Telephone Encounter (Signed)
Mailed Certified Letter [phone]

## 2014-01-20 ENCOUNTER — Emergency Department (HOSPITAL_BASED_OUTPATIENT_CLINIC_OR_DEPARTMENT_OTHER)
Admission: EM | Admit: 2014-01-20 | Discharge: 2014-01-20 | Disposition: A | Discharge status: Home / Self Care | Payer: BLUE CROSS/BLUE SHIELD | Attending: Emergency Medicine | Admitting: Emergency Medicine

## 2014-01-20 ENCOUNTER — Encounter (HOSPITAL_BASED_OUTPATIENT_CLINIC_OR_DEPARTMENT_OTHER): Payer: Self-pay

## 2014-01-20 ENCOUNTER — Emergency Department (HOSPITAL_BASED_OUTPATIENT_CLINIC_OR_DEPARTMENT_OTHER): Payer: BLUE CROSS/BLUE SHIELD

## 2014-01-20 DIAGNOSIS — N8320 Unspecified ovarian cysts: Secondary | ICD-10-CM | POA: Insufficient documentation

## 2014-01-20 DIAGNOSIS — Z862 Personal history of diseases of the blood and blood-forming organs and certain disorders involving the immune mechanism: Secondary | ICD-10-CM | POA: Diagnosis not present

## 2014-01-20 DIAGNOSIS — R1031 Right lower quadrant pain: Secondary | ICD-10-CM | POA: Diagnosis present

## 2014-01-20 DIAGNOSIS — Z90710 Acquired absence of both cervix and uterus: Secondary | ICD-10-CM | POA: Diagnosis not present

## 2014-01-20 DIAGNOSIS — Z79899 Other long term (current) drug therapy: Secondary | ICD-10-CM | POA: Diagnosis not present

## 2014-01-20 DIAGNOSIS — Z8673 Personal history of transient ischemic attack (TIA), and cerebral infarction without residual deficits: Secondary | ICD-10-CM | POA: Insufficient documentation

## 2014-01-20 DIAGNOSIS — Z8619 Personal history of other infectious and parasitic diseases: Secondary | ICD-10-CM | POA: Insufficient documentation

## 2014-01-20 DIAGNOSIS — N83201 Unspecified ovarian cyst, right side: Secondary | ICD-10-CM

## 2014-01-20 LAB — CBC WITH DIFFERENTIAL/PLATELET
Basophils Absolute: 0.1 10*3/uL (ref 0.0–0.1)
Basophils Relative: 1 % (ref 0–1)
EOS PCT: 2 % (ref 0–5)
Eosinophils Absolute: 0.1 10*3/uL (ref 0.0–0.7)
HEMATOCRIT: 37.8 % (ref 36.0–46.0)
HEMOGLOBIN: 12.6 g/dL (ref 12.0–15.0)
LYMPHS PCT: 29 % (ref 12–46)
Lymphs Abs: 1.7 10*3/uL (ref 0.7–4.0)
MCH: 31.3 pg (ref 26.0–34.0)
MCHC: 33.3 g/dL (ref 30.0–36.0)
MCV: 93.8 fL (ref 78.0–100.0)
MONOS PCT: 11 % (ref 3–12)
Monocytes Absolute: 0.6 10*3/uL (ref 0.1–1.0)
Neutro Abs: 3.3 10*3/uL (ref 1.7–7.7)
Neutrophils Relative %: 57 % (ref 43–77)
PLATELETS: 304 10*3/uL (ref 150–400)
RBC: 4.03 MIL/uL (ref 3.87–5.11)
RDW: 15.4 % (ref 11.5–15.5)
WBC: 5.8 10*3/uL (ref 4.0–10.5)

## 2014-01-20 LAB — URINALYSIS, ROUTINE W REFLEX MICROSCOPIC
BILIRUBIN URINE: NEGATIVE
Glucose, UA: NEGATIVE mg/dL
HGB URINE DIPSTICK: NEGATIVE
Ketones, ur: NEGATIVE mg/dL
Leukocytes, UA: NEGATIVE
Nitrite: NEGATIVE
Protein, ur: NEGATIVE mg/dL
SPECIFIC GRAVITY, URINE: 1.012 (ref 1.005–1.030)
UROBILINOGEN UA: 0.2 mg/dL (ref 0.0–1.0)
pH: 6.5 (ref 5.0–8.0)

## 2014-01-20 LAB — BASIC METABOLIC PANEL
Anion gap: 6 (ref 5–15)
BUN: 17 mg/dL (ref 6–23)
CO2: 26 mmol/L (ref 19–32)
Calcium: 8.7 mg/dL (ref 8.4–10.5)
Chloride: 105 mEq/L (ref 96–112)
Creatinine, Ser: 0.62 mg/dL (ref 0.50–1.10)
GFR calc Af Amer: >90 mL/min (ref >90)
Glucose, Bld: 88 mg/dL (ref 70–99)
POTASSIUM: 3.8 mmol/L (ref 3.5–5.1)
SODIUM: 137 mmol/L (ref 135–145)

## 2014-01-20 MED ORDER — NAPROXEN 500 MG PO TABS: 500.0000 mg | ORAL_TABLET | Freq: Two times a day (BID) | ORAL | Status: DC

## 2014-01-20 MED ORDER — TRAMADOL HCL 50 MG PO TABS: 50.0000 mg | ORAL_TABLET | Freq: Four times a day (QID) | ORAL | Status: DC | PRN

## 2014-01-20 NOTE — Discharge Instructions (Signed)
Naprosyn for pain and inflammation. Ultram for severe pain. Follow up with GYN doctor regarding your ovarian cyst. Return if symptoms worsening.   Ovarian Cyst An ovarian cyst is a fluid-filled sac that forms on an ovary. The ovaries are small organs that produce eggs in women. Various types of cysts can form on the ovaries. Most are not cancerous. Many do not cause problems, and they often go away on their own. Some may cause symptoms and require treatment. Common types of ovarian cysts include:  Functional cysts--These cysts may occur every month during the menstrual cycle. This is normal. The cysts usually go away with the next menstrual cycle if the woman does not get pregnant. Usually, there are no symptoms with a functional cyst.  Endometrioma cysts--These cysts form from the tissue that lines the uterus. They are also called "chocolate cysts" because they become filled with blood that turns brown. This type of cyst can cause pain in the lower abdomen during intercourse and with your menstrual period.  Cystadenoma cysts--This type develops from the cells on the outside of the ovary. These cysts can get very big and cause lower abdomen pain and pain with intercourse. This type of cyst can twist on itself, cut off its blood supply, and cause severe pain. It can also easily rupture and cause a lot of pain.  Dermoid cysts--This type of cyst is sometimes found in both ovaries. These cysts may contain different kinds of body tissue, such as skin, teeth, hair, or cartilage. They usually do not cause symptoms unless they get very big.  Theca lutein cysts--These cysts occur when too much of a certain hormone (human chorionic gonadotropin) is produced and overstimulates the ovaries to produce an egg. This is most common after procedures used to assist with the conception of a baby (in vitro fertilization). CAUSES   Fertility drugs can cause a condition in which multiple large cysts are formed on the  ovaries. This is called ovarian hyperstimulation syndrome.  A condition called polycystic ovary syndrome can cause hormonal imbalances that can lead to nonfunctional ovarian cysts. SIGNS AND SYMPTOMS  Many ovarian cysts do not cause symptoms. If symptoms are present, they may include:  Pelvic pain or pressure.  Pain in the lower abdomen.  Pain during sexual intercourse.  Increasing girth (swelling) of the abdomen.  Abnormal menstrual periods.  Increasing pain with menstrual periods.  Stopping having menstrual periods without being pregnant. DIAGNOSIS  These cysts are commonly found during a routine or annual pelvic exam. Tests may be ordered to find out more about the cyst. These tests may include:  Ultrasound.  X-ray of the pelvis.  CT scan.  MRI.  Blood tests. TREATMENT  Many ovarian cysts go away on their own without treatment. Your health care provider may want to check your cyst regularly for 2-3 months to see if it changes. For women in menopause, it is particularly important to monitor a cyst closely because of the higher rate of ovarian cancer in menopausal women. When treatment is needed, it may include any of the following:  A procedure to drain the cyst (aspiration). This may be done using a long needle and ultrasound. It can also be done through a laparoscopic procedure. This involves using a thin, lighted tube with a tiny camera on the end (laparoscope) inserted through a small incision.  Surgery to remove the whole cyst. This may be done using laparoscopic surgery or an open surgery involving a larger incision in the lower abdomen.  Hormone treatment or birth control pills. These methods are sometimes used to help dissolve a cyst. HOME CARE INSTRUCTIONS   Only take over-the-counter or prescription medicines as directed by your health care provider.  Follow up with your health care provider as directed.  Get regular pelvic exams and Pap tests. SEEK MEDICAL  CARE IF:   Your periods are late, irregular, or painful, or they stop.  Your pelvic pain or abdominal pain does not go away.  Your abdomen becomes larger or swollen.  You have pressure on your bladder or trouble emptying your bladder completely.  You have pain during sexual intercourse.  You have feelings of fullness, pressure, or discomfort in your stomach.  You lose weight for no apparent reason.  You feel generally ill.  You become constipated.  You lose your appetite.  You develop acne.  You have an increase in body and facial hair.  You are gaining weight, without changing your exercise and eating habits.  You think you are pregnant. SEEK IMMEDIATE MEDICAL CARE IF:   You have increasing abdominal pain.  You feel sick to your stomach (nauseous), and you throw up (vomit).  You develop a fever that comes on suddenly.  You have abdominal pain during a bowel movement.  Your menstrual periods become heavier than usual. MAKE SURE YOU:  Understand these instructions.  Will watch your condition.  Will get help right away if you are not doing well or get worse. Document Released: 12/25/2004 Document Revised: 12/30/2012 Document Reviewed: 09/01/2012 [facility] Patient Information 2015 [place], [place]. This information is not intended to replace advice given to you by your health care provider. Make sure you discuss any questions you have with your health care provider.

## 2014-01-20 NOTE — ED Notes (Signed)
C/o RLQ pain x 2 days-nausea-denies v/d-denies urinary s/s and vaginal d/c

## 2014-01-20 NOTE — ED Provider Notes (Signed)
CSN: [id number]     Arrival date & time 01/20/14  1242 History   First MD Initiated Contact with Patient 01/20/14 1253     Chief Complaint  Patient presents with  . Abdominal Pain     (Consider location/radiation/quality/duration/timing/severity/associated sxs/prior Treatment) HPI Mary Armstrong is a 44 y.o. female with history of reported CVA at age 58, anemia, menorrhagia, presents to emergency department complaining of right lower abdominal pain. Patient states pain began about 3 days ago. States pain is, then goes. Pain is sharp when it comes on, lasts only several seconds to minutes at a time. States when she gets the pain the pain is 15-20 out of 10 on a scale. States when pain goes away she has no pain. She has had hysterectomy half a year ago, still has her ovaries. She denies any changes in her bowels, last bowel movement today and normal. No nausea or vomiting. She has not taken anything for her symptoms. She denies history of similar pain in the past. type. No other complaints.  Past Medical History  Diagnosis Date  . Menorrhagia 04/19/2011  . Genital herpes   . Stroke     "light stroke" age 54 or 1yrs  . Anemia    Past Surgical History  Procedure Laterality Date  . Myomectomy  2005  . Myomectomy N/A 07/14/2013    Procedure: MYOMECTOMY;  Surgeon: Osborne Oman, MD;  Location: [facility];  Service: Gynecology;  Laterality: N/A;  . Supracervical abdominal hysterectomy N/A 07/14/2013    Procedure: HYSTERECTOMY SUPRACERVICAL ABDOMINAL;  Surgeon: Osborne Oman, MD;  Location: [facility];  Service: Gynecology;  Laterality: N/A;  . Salpingoophorectomy Left 07/14/2013    Procedure: SALPINGO OOPHORECTOMY;  Surgeon: Osborne Oman, MD;  Location: [facility];  Service: Gynecology;  Laterality: Left;  . Lysis of adhesion N/A 07/14/2013    Procedure: EXTENSIVE LYSIS OF ADHESIONS, ENTERO LYSIS OF ADHESIONS;  Surgeon: Osborne Oman, MD;  Location: [facility];  Service: Gynecology;  Laterality: N/A;  .  Abdominal hysterectomy     Family History  Problem Relation Age of Onset  . Hypertension Mother     Living, 62  . Thyroid disease Mother   . Healthy Brother     Living, 21  . Breast cancer Maternal Grandmother     Died, 68  . Heart disease Maternal Grandmother    History  Substance Use Topics  . Smoking status: Never Smoker   . Smokeless tobacco: Never Used  . Alcohol Use: Yes     Comment: social use   OB History    Gravida Para Term Preterm AB TAB SAB Ectopic Multiple Living   5 0   5 3 1 1   0     Review of Systems  Constitutional: Negative for fever and chills.  Respiratory: Negative for cough, chest tightness and shortness of breath.   Cardiovascular: Negative for chest pain, palpitations and leg swelling.  Gastrointestinal: Positive for abdominal pain. Negative for nausea, vomiting and diarrhea.  Genitourinary: Positive for pelvic pain. Negative for dysuria, urgency, frequency, flank pain, vaginal bleeding, vaginal discharge and vaginal pain.  Musculoskeletal: Negative for myalgias, arthralgias, neck pain and neck stiffness.  Skin: Negative for rash.  Neurological: Negative for dizziness, weakness and headaches.  All other systems reviewed and are negative.     Allergies  Review of patient's allergies indicates no known allergies.  Home Medications   Prior to Admission medications   Medication Sig Start Date End Date Taking? Authorizing  Provider  Oxycodone-Acetaminophen (PERCOCET PO) Take by mouth.   Yes Historical Provider, MD  docusate sodium 100 MG CAPS Take 100 mg by mouth 2 (two) times daily as needed for mild constipation. 07/16/13   Osborne Oman, MD  valACYclovir (VALTREX) 500 MG tablet Take 1 tablet (500 mg total) by mouth 2 (two) times daily. 07/16/13   Osborne Oman, MD   LMP 06/03/2013 Physical Exam  Constitutional: She appears well-developed and well-nourished. No distress.  HENT:  Head: Normocephalic.  Eyes: Conjunctivae are normal.  Neck:  Neck supple.  Cardiovascular: Normal rate, regular rhythm and normal heart sounds.   Pulmonary/Chest: Effort normal and breath sounds normal. No respiratory distress. She has no wheezes. She has no rales.  Abdominal: Soft. Bowel sounds are normal. She exhibits no distension. There is tenderness. There is no rebound and no guarding.  Right adnexal tenderness  Musculoskeletal: She exhibits no edema.  Neurological: She is alert.  Skin: Skin is warm and dry.  Psychiatric: She has a normal mood and affect. Her behavior is normal.  Nursing note and vitals reviewed.   ED Course  Procedures (including critical care time) Labs Review Labs Reviewed  URINALYSIS, ROUTINE W REFLEX MICROSCOPIC  CBC WITH DIFFERENTIAL  BASIC METABOLIC PANEL    Imaging Review US Transvaginal Non-ob  01/20/2014   CLINICAL DATA:  Right lower quadrant pain for 2 days. Prior hysterectomy in 07/2013 and left salpingo oophorectomy. History of fibroids.  EXAM: TRANSABDOMINAL AND TRANSVAGINAL ULTRASOUND OF PELVIS  DOPPLER ULTRASOUND OF OVARIES  TECHNIQUE: Both transabdominal and transvaginal ultrasound examinations of the pelvis were performed. Transabdominal technique was performed for global imaging of the pelvis including uterus, ovaries, adnexal regions, and pelvic cul-de-sac.  It was necessary to proceed with endovaginal exam following the transabdominal exam to visualize the right ovary. Color and duplex Doppler ultrasound was utilized to evaluate blood flow to the ovaries.  COMPARISON:  CT abdomen and pelvis 07/18/2013. Pelvic ultrasound 05/22/2013.  FINDINGS: Uterus  Surgically absent.  Left ovary  Surgically absent.  Right ovary  Measurements: 3.4 x 2.8 x 3.1 cm. Cyst with single thin internal septation and otherwise simple appearance measures 2.3 x 2.4 x 1.8 cm. There is an oval 1.3 x 1.2 x 1.1 cm focus in the every which is slightly hypoechoic compared to adjacent ovarian parenchyma with a small amount of internal  vascularity on color Doppler.  Pulsed Doppler evaluation of the right ovary demonstrates normal low-resistance arterial and venous waveforms.  Other findings  No free fluid.  IMPRESSION: 1. Status post hysterectomy and left oophorectomy. 2. No evidence of right ovarian torsion during this examination. Right ovarian cyst with single internal septation and questionable 1.3 cm solid nodule versus artifact. Repeat pelvic ultrasound is suggested in 6 weeks to assess for resolution.   Electronically Signed   By: Logan Bores   On: 01/20/2014 14:51   US Pelvis Complete  01/20/2014   CLINICAL DATA:  Right lower quadrant pain for 2 days. Prior hysterectomy in 07/2013 and left salpingo oophorectomy. History of fibroids.  EXAM: TRANSABDOMINAL AND TRANSVAGINAL ULTRASOUND OF PELVIS  DOPPLER ULTRASOUND OF OVARIES  TECHNIQUE: Both transabdominal and transvaginal ultrasound examinations of the pelvis were performed. Transabdominal technique was performed for global imaging of the pelvis including uterus, ovaries, adnexal regions, and pelvic cul-de-sac.  It was necessary to proceed with endovaginal exam following the transabdominal exam to visualize the right ovary. Color and duplex Doppler ultrasound was utilized to evaluate blood flow to the ovaries.  COMPARISON:  CT abdomen and pelvis 07/18/2013. Pelvic ultrasound 05/22/2013.  FINDINGS: Uterus  Surgically absent.  Left ovary  Surgically absent.  Right ovary  Measurements: 3.4 x 2.8 x 3.1 cm. Cyst with single thin internal septation and otherwise simple appearance measures 2.3 x 2.4 x 1.8 cm. There is an oval 1.3 x 1.2 x 1.1 cm focus in the every which is slightly hypoechoic compared to adjacent ovarian parenchyma with a small amount of internal vascularity on color Doppler.  Pulsed Doppler evaluation of the right ovary demonstrates normal low-resistance arterial and venous waveforms.  Other findings  No free fluid.  IMPRESSION: 1. Status post hysterectomy and left  oophorectomy. 2. No evidence of right ovarian torsion during this examination. Right ovarian cyst with single internal septation and questionable 1.3 cm solid nodule versus artifact. Repeat pelvic ultrasound is suggested in 6 weeks to assess for resolution.   Electronically Signed   By: Logan Bores   On: 01/20/2014 14:51   [place] Art/ven Flow Abd Pelv Doppler  01/20/2014   CLINICAL DATA:  Right lower quadrant pain for 2 days. Prior hysterectomy in 07/2013 and left salpingo oophorectomy. History of fibroids.  EXAM: TRANSABDOMINAL AND TRANSVAGINAL ULTRASOUND OF PELVIS  DOPPLER ULTRASOUND OF OVARIES  TECHNIQUE: Both transabdominal and transvaginal ultrasound examinations of the pelvis were performed. Transabdominal technique was performed for global imaging of the pelvis including uterus, ovaries, adnexal regions, and pelvic cul-de-sac.  It was necessary to proceed with endovaginal exam following the transabdominal exam to visualize the right ovary. Color and duplex Doppler ultrasound was utilized to evaluate blood flow to the ovaries.  COMPARISON:  CT abdomen and pelvis 07/18/2013. Pelvic ultrasound 05/22/2013.  FINDINGS: Uterus  Surgically absent.  Left ovary  Surgically absent.  Right ovary  Measurements: 3.4 x 2.8 x 3.1 cm. Cyst with single thin internal septation and otherwise simple appearance measures 2.3 x 2.4 x 1.8 cm. There is an oval 1.3 x 1.2 x 1.1 cm focus in the every which is slightly hypoechoic compared to adjacent ovarian parenchyma with a small amount of internal vascularity on color Doppler.  Pulsed Doppler evaluation of the right ovary demonstrates normal low-resistance arterial and venous waveforms.  Other findings  No free fluid.  IMPRESSION: 1. Status post hysterectomy and left oophorectomy. 2. No evidence of right ovarian torsion during this examination. Right ovarian cyst with single internal septation and questionable 1.3 cm solid nodule versus artifact. Repeat pelvic ultrasound is suggested  in 6 weeks to assess for resolution.   Electronically Signed   By: Logan Bores   On: 01/20/2014 14:51     EKG Interpretation None      MDM   Final diagnoses:  Right lower quadrant abdominal pain  Cyst of right ovary   Patient with right lower quadrant pain, more of right adnexal pain. No McBurney's point tenderness. She is pain-free at this time. Pain comes and goes lasting only seconds to minutes at a time. Pain is sharp and severe. At this time she is asymptomatic. There is mild tenderness to palpation over the right adnexa. Will get labs and ultrasound of pelvis.  Patient's ultrasound shows right ovarian cyst, possible 0.3 cm solid nodule versus artifact, and discuss results with patient. Will have her follow-up with GYN for further study. Home with naproxen, Ultram. I do not think at this time patient has appendicitis or any other intestinal abnormalities, no tenderness on exam, no change in bowels, no nausea or vomiting. No melena blood cell count. Labs  otherwise normal. Instructed to return however if her symptoms are worsening. Patient voiced understanding.  Filed Vitals:   01/20/14 1249 01/20/14 1509  BP: 126/67 120/79  Pulse: 68 61  Temp: 98.4 F (36.9 C) 98.5 F (36.9 C)  TempSrc: Oral   Resp: 20 16  Height: 5\' 2"  (1.575 m)   Weight: 135 lb (61.236 kg)   SpO2: 100% 100%     Renold Genta, PA-C 01/20/14 2212  Tanna Furry, MD 01/30/14 [phone]

## 2014-01-26 ENCOUNTER — Encounter: Payer: Self-pay | Admitting: General Practice

## 2014-01-27 ENCOUNTER — Telehealth: Payer: Self-pay | Admitting: *Deleted

## 2014-01-27 NOTE — Telephone Encounter (Signed)
Spoke to pt about ED visit and pt would like to follow up with Dr. Roselie Awkward in [place]. Appt made for 02/10/14 at 2:45pm. Pt expressed understanding.

## 2014-02-10 ENCOUNTER — Ambulatory Visit (INDEPENDENT_AMBULATORY_CARE_PROVIDER_SITE_OTHER): Payer: BLUE CROSS/BLUE SHIELD | Admitting: Obstetrics & Gynecology

## 2014-02-10 ENCOUNTER — Encounter: Payer: Self-pay | Admitting: Obstetrics & Gynecology

## 2014-02-10 VITALS — BP 107/65 | HR 63 | Temp 98.5°F | Resp 20 | Ht 62.0 in | Wt 146.6 lb

## 2014-02-10 DIAGNOSIS — N8320 Unspecified ovarian cysts: Secondary | ICD-10-CM | POA: Diagnosis not present

## 2014-02-10 DIAGNOSIS — N83201 Unspecified ovarian cyst, right side: Secondary | ICD-10-CM

## 2014-02-10 NOTE — Progress Notes (Signed)
Pt states she has been taking both ibuprofen and aleve together for pain.

## 2014-02-10 NOTE — Progress Notes (Signed)
Patient ID: Mary Armstrong, female   DOB: 1970/06/06, 44 y.o.   MRN: [id number]  Chief Complaint  Patient presents with  . Follow-up    [facility]    HPI Mary Armstrong is a 44 y.o. female.  [id number] Patient's last menstrual period was 06/03/2013. 3 weeks of RLQ pain and she went to [facility] 01/20/14. Still intermittent pain HPI  Past Medical History  Diagnosis Date  . Menorrhagia 04/19/2011  . Genital herpes   . Stroke     "light stroke" age 56 or 22yrs  . Anemia     Past Surgical History  Procedure Laterality Date  . Myomectomy  2005  . Myomectomy N/A 07/14/2013    Procedure: MYOMECTOMY;  Surgeon: Osborne Oman, MD;  Location: [facility];  Service: Gynecology;  Laterality: N/A;  . Supracervical abdominal hysterectomy N/A 07/14/2013    Procedure: HYSTERECTOMY SUPRACERVICAL ABDOMINAL;  Surgeon: Osborne Oman, MD;  Location: [facility];  Service: Gynecology;  Laterality: N/A;  . Salpingoophorectomy Left 07/14/2013    Procedure: SALPINGO OOPHORECTOMY;  Surgeon: Osborne Oman, MD;  Location: [facility];  Service: Gynecology;  Laterality: Left;  . Lysis of adhesion N/A 07/14/2013    Procedure: EXTENSIVE LYSIS OF ADHESIONS, ENTERO LYSIS OF ADHESIONS;  Surgeon: Osborne Oman, MD;  Location: [facility];  Service: Gynecology;  Laterality: N/A;  . Abdominal hysterectomy      Family History  Problem Relation Age of Onset  . Hypertension Mother     Living, 38  . Thyroid disease Mother   . Healthy Brother     Living, 68  . Breast cancer Maternal Grandmother     Died, 68  . Heart disease Maternal Grandmother     Social History History  Substance Use Topics  . Smoking status: Never Smoker   . Smokeless tobacco: Never Used  . Alcohol Use: Yes     Comment: social use    No Known Allergies  Current Outpatient Prescriptions  Medication Sig Dispense Refill  . ibuprofen (ADVIL,MOTRIN) 600 MG tablet Take 600 mg by mouth every 6 (six) hours as needed.    . naproxen (NAPROSYN) 500 MG tablet  Take 1 tablet (500 mg total) by mouth 2 (two) times daily. 30 tablet 0  . docusate sodium 100 MG CAPS Take 100 mg by mouth 2 (two) times daily as needed for mild constipation. (Patient not taking: Reported on 02/10/2014) 30 capsule 3  . Oxycodone-Acetaminophen (PERCOCET PO) Take by mouth.    . traMADol (ULTRAM) 50 MG tablet Take 1 tablet (50 mg total) by mouth every 6 (six) hours as needed. (Patient not taking: Reported on 02/10/2014) 20 tablet 0  . valACYclovir (VALTREX) 500 MG tablet Take 1 tablet (500 mg total) by mouth 2 (two) times daily. (Patient not taking: Reported on 02/10/2014) 10 tablet 3   No current facility-administered medications for this visit.    Review of Systems Review of Systems  Constitutional: Negative.   Gastrointestinal: Positive for nausea.  Genitourinary: Positive for pelvic pain. Negative for dysuria and vaginal discharge.    Blood pressure 107/65, pulse 63, temperature 98.5 F (36.9 C), temperature source Oral, resp. rate 20, height 5\' 2"  (1.575 m), weight 146 lb 9.6 oz (66.497 kg), last menstrual period 06/03/2013.  Physical Exam Physical Exam  Constitutional: She appears well-developed. No distress.  Pulmonary/Chest: Effort normal.  Abdominal: Soft. She exhibits no mass. There is no tenderness.  Genitourinary: Vagina normal. No vaginal discharge found.  No mass no tenderness  Skin: Skin is warm.  Psychiatric: She has a normal mood and affect. Her behavior is normal.    Data Reviewed   CLINICAL DATA: Right lower quadrant pain for 2 days. Prior hysterectomy in 07/2013 and left salpingo oophorectomy. History of fibroids.  EXAM: TRANSABDOMINAL AND TRANSVAGINAL ULTRASOUND OF PELVIS  DOPPLER ULTRASOUND OF OVARIES  TECHNIQUE: Both transabdominal and transvaginal ultrasound examinations of the pelvis were performed. Transabdominal technique was performed for global imaging of the pelvis including uterus, ovaries, adnexal regions, and pelvic  cul-de-sac.  It was necessary to proceed with endovaginal exam following the transabdominal exam to visualize the right ovary. Color and duplex Doppler ultrasound was utilized to evaluate blood flow to the ovaries.  COMPARISON: CT abdomen and pelvis 07/18/2013. Pelvic ultrasound 05/22/2013.  FINDINGS: Uterus  Surgically absent.  Left ovary  Surgically absent.  Right ovary  Measurements: 3.4 x 2.8 x 3.1 cm. Cyst with single thin internal septation and otherwise simple appearance measures 2.3 x 2.4 x 1.8 cm. There is an oval 1.3 x 1.2 x 1.1 cm focus in the every which is slightly hypoechoic compared to adjacent ovarian parenchyma with a small amount of internal vascularity on color Doppler.  Pulsed Doppler evaluation of the right ovary demonstrates normal low-resistance arterial and venous waveforms.  Other findings  No free fluid.  IMPRESSION: 1. Status post hysterectomy and left oophorectomy. 2. No evidence of right ovarian torsion during this examination. Right ovarian cyst with single internal septation and questionable 1.3 cm solid nodule versus artifact. Repeat pelvic ultrasound is suggested in 6 weeks to assess for resolution.   Electronically Signed  By: Logan Bores  On: 01/20/2014 14:51     Assessment    RLQ pain and ovarian cyst, S/P TAH/LSO     Plan    Repeat US and RTC        Doral Digangi 02/10/2014, 4:11 PM

## 2014-02-22 ENCOUNTER — Inpatient Hospital Stay (HOSPITAL_COMMUNITY): Payer: BLUE CROSS/BLUE SHIELD

## 2014-02-22 ENCOUNTER — Inpatient Hospital Stay (HOSPITAL_BASED_OUTPATIENT_CLINIC_OR_DEPARTMENT_OTHER)
Admission: EM | Admit: 2014-02-22 | Discharge: 2014-02-22 | Disposition: A | Discharge status: Home / Self Care | Payer: Self-pay | Attending: Obstetrics and Gynecology | Admitting: Obstetrics and Gynecology

## 2014-02-22 ENCOUNTER — Encounter (HOSPITAL_BASED_OUTPATIENT_CLINIC_OR_DEPARTMENT_OTHER): Payer: Self-pay | Admitting: Emergency Medicine

## 2014-02-22 DIAGNOSIS — N949 Unspecified condition associated with female genital organs and menstrual cycle: Secondary | ICD-10-CM | POA: Insufficient documentation

## 2014-02-22 DIAGNOSIS — R102 Pelvic and perineal pain: Secondary | ICD-10-CM

## 2014-02-22 DIAGNOSIS — R109 Unspecified abdominal pain: Secondary | ICD-10-CM | POA: Insufficient documentation

## 2014-02-22 DIAGNOSIS — N8320 Unspecified ovarian cysts: Secondary | ICD-10-CM | POA: Insufficient documentation

## 2014-02-22 DIAGNOSIS — N83202 Unspecified ovarian cyst, left side: Secondary | ICD-10-CM

## 2014-02-22 LAB — WET PREP, GENITAL
CLUE CELLS WET PREP: NONE SEEN
TRICH WET PREP: NONE SEEN
Yeast Wet Prep HPF POC: NONE SEEN

## 2014-02-22 LAB — URINALYSIS, ROUTINE W REFLEX MICROSCOPIC
Bilirubin Urine: NEGATIVE
Glucose, UA: NEGATIVE mg/dL
Ketones, ur: NEGATIVE mg/dL
LEUKOCYTES UA: NEGATIVE
Nitrite: NEGATIVE
PH: 5.5 (ref 5.0–8.0)
Protein, ur: NEGATIVE mg/dL
Specific Gravity, Urine: 1.01 (ref 1.005–1.030)
Urobilinogen, UA: 0.2 mg/dL (ref 0.0–1.0)

## 2014-02-22 LAB — URINE MICROSCOPIC-ADD ON

## 2014-02-22 LAB — GC/CHLAMYDIA PROBE AMP (~~LOC~~) NOT AT ARMC
CHLAMYDIA, DNA PROBE: NEGATIVE
NEISSERIA GONORRHEA: NEGATIVE

## 2014-02-22 MED ORDER — ONDANSETRON HCL 4 MG/2ML IJ SOLN
4.0000 mg | Freq: Once | INTRAMUSCULAR | Status: AC
  Administered 2014-02-22: 4 mg via INTRAVENOUS
  Filled 2014-02-22: qty 2

## 2014-02-22 MED ORDER — HYDROMORPHONE HCL 1 MG/ML IJ SOLN
1.0000 mg | Freq: Once | INTRAMUSCULAR | Status: AC
  Administered 2014-02-22: 1 mg via INTRAVENOUS
  Filled 2014-02-22: qty 1

## 2014-02-22 NOTE — ED Notes (Addendum)
IV site R antecubital flushed and saline locked prior to departure site assessed as WNL. Pt and S.O. Verbalized understanding that the are to go directly from [place] to [facility]U for ultrasound abd.

## 2014-02-22 NOTE — MAU Note (Signed)
Received pt from [facility] via her personal car with saline lock in R antecubital to rule out ovarian torsion.  Pt denies any pain at this time.

## 2014-02-22 NOTE — MAU Provider Note (Signed)
History     CSN: [id number]  Arrival date and time: 02/22/14 4696   First Provider Initiated Contact with Patient 02/22/14 0158      Chief Complaint  Patient presents with  . Abdominal Pain   HPI  Mary Armstrong is a 44 y.o. [id number] who is a transfer from [facility] for concern for torsion. She states that she has a hx of an ovarian cyst, and today she had worsening pain. She states that she has tried ibuprofen and aleve, but they they have not helped. She states that she had something at [place], but she doesn't remember the name. She states that medication took the pain away. She had a hysterectomy and Right Oophorectomy in July, 2015. She has not had any concerns since surgery. She denies nausea, vomiting or fever.   Past Medical History  Diagnosis Date  . Menorrhagia 04/19/2011  . Genital herpes   . Stroke     "light stroke" age 60 or 59yrs  . Anemia     Past Surgical History  Procedure Laterality Date  . Myomectomy  2005  . Myomectomy N/A 07/14/2013    Procedure: MYOMECTOMY;  Surgeon: Osborne Oman, MD;  Location: [place] [facility];  Service: Gynecology;  Laterality: N/A;  . Supracervical abdominal hysterectomy N/A 07/14/2013    Procedure: HYSTERECTOMY SUPRACERVICAL ABDOMINAL;  Surgeon: Osborne Oman, MD;  Location: [place] [facility];  Service: Gynecology;  Laterality: N/A;  . Salpingoophorectomy Left 07/14/2013    Procedure: SALPINGO OOPHORECTOMY;  Surgeon: Osborne Oman, MD;  Location: [place] [facility];  Service: Gynecology;  Laterality: Left;  . Lysis of adhesion N/A 07/14/2013    Procedure: EXTENSIVE LYSIS OF ADHESIONS, ENTERO LYSIS OF ADHESIONS;  Surgeon: Osborne Oman, MD;  Location: [place] [facility];  Service: Gynecology;  Laterality: N/A;  . Abdominal hysterectomy      Family History  Problem Relation Age of Onset  . Hypertension Mother     Living, 62  . Thyroid disease Mother   . Healthy Brother     Living, 76  . Breast cancer Maternal Grandmother     Died, 68  . Heart disease Maternal Grandmother      History  Substance Use Topics  . Smoking status: Never Smoker   . Smokeless tobacco: Never Used  . Alcohol Use: Yes     Comment: social use    Allergies: No Known Allergies  Prescriptions prior to admission  Medication Sig Dispense Refill Last Dose  . docusate sodium 100 MG CAPS Take 100 mg by mouth 2 (two) times daily as needed for mild constipation. (Patient not taking: Reported on 02/10/2014) 30 capsule 3 Not Taking  . ibuprofen (ADVIL,MOTRIN) 600 MG tablet Take 600 mg by mouth every 6 (six) hours as needed.     . naproxen (NAPROSYN) 500 MG tablet Take 1 tablet (500 mg total) by mouth 2 (two) times daily. 30 tablet 0 Taking  . Oxycodone-Acetaminophen (PERCOCET PO) Take by mouth.   Not Taking  . traMADol (ULTRAM) 50 MG tablet Take 1 tablet (50 mg total) by mouth every 6 (six) hours as needed. (Patient not taking: Reported on 02/10/2014) 20 tablet 0 Not Taking  . valACYclovir (VALTREX) 500 MG tablet Take 1 tablet (500 mg total) by mouth 2 (two) times daily. (Patient not taking: Reported on 02/10/2014) 10 tablet 3 Not Taking    ROS Physical Exam   Blood pressure 118/72, pulse 72, temperature 98 F (36.7 C), temperature source Oral, resp. rate 18, height 5\' 2"  (1.575 m),  weight 66.225 kg (146 lb), last menstrual period 06/03/2013, SpO2 98 %.  Physical Exam  Nursing note and vitals reviewed. Constitutional: She is oriented to person, place, and time. She appears well-developed and well-nourished. No distress.  Cardiovascular: Normal rate.   Respiratory: Effort normal.  GI: Soft. There is no tenderness. There is no rebound.  Genitourinary:   External: no lesion Vagina: large amount of thick, white discharge Cervix: SA Uterus: SA Adnexa: NT   Neurological: She is alert and oriented to person, place, and time.  Skin: Skin is warm and dry.  Psychiatric: She has a normal mood and affect.    MAU Course  Procedures  Results for orders placed or performed during the hospital  encounter of 02/22/14 (from the past 24 hour(s))  Urinalysis, Routine w reflex microscopic     Status: Abnormal   Collection Time: 02/22/14 12:51 AM  Result Value Ref Range   Color, Urine YELLOW YELLOW   APPearance CLEAR CLEAR   Specific Gravity, Urine 1.010 1.005 - 1.030   pH 5.5 5.0 - 8.0   Glucose, UA NEGATIVE NEGATIVE mg/dL   Hgb urine dipstick TRACE (A) NEGATIVE   Bilirubin Urine NEGATIVE NEGATIVE   Ketones, ur NEGATIVE NEGATIVE mg/dL   Protein, ur NEGATIVE NEGATIVE mg/dL   Urobilinogen, UA 0.2 0.0 - 1.0 mg/dL   Nitrite NEGATIVE NEGATIVE   Leukocytes, UA NEGATIVE NEGATIVE  Urine microscopic-add on     Status: Abnormal   Collection Time: 02/22/14 12:51 AM  Result Value Ref Range   Squamous Epithelial / LPF MANY (A) RARE   WBC, UA 0-2 <3 WBC/hpf   RBC / HPF 0-2 <3 RBC/hpf   Bacteria, UA RARE RARE    US Transvaginal Non-ob  02/22/2014   CLINICAL DATA:  Right ovarian cyst.  EXAM: TRANSVAGINAL ULTRASOUND OF PELVIS  DOPPLER ULTRASOUND OF OVARIES  TECHNIQUE: Transvaginal ultrasound examination of the pelvis was performed including evaluation of the uterus, ovaries, adnexal regions, and pelvic cul-de-sac.  Color and duplex Doppler ultrasound was utilized to evaluate blood flow to the ovaries.  COMPARISON:  01/20/2014  FINDINGS: There is prior hysterectomy and left oophorectomy  Right ovary  Measurements: 3.2 x 3.4 x 3.5 cm. The mildly complex cyst persists, measuring 2.4 x 2.4 x 3.0 cm. It contains a thin septation and there is an internal fluid fluid level suggesting hemorrhage. There is an equivocal nodule measuring 1.3 cm. Overall the appearances of the cysts have changed only to the extent that there now is a defined fluid fluid level internally.  Pulsed Doppler evaluation demonstrates normal arterial and venous waveforms in the right ovary.  IMPRESSION: Persistent mildly complex right ovarian cyst. Based on the internal fluid fluid level, this most likely is a hemorrhagic cyst. Recommend  continued follow-up in 6-8 weeks.   Electronically Signed   By: Andreas Newport M.D.   On: 02/22/2014 03:06   [place] Art/ven Flow Abd Pelv Doppler  02/22/2014   CLINICAL DATA:  Right ovarian cyst.  EXAM: TRANSVAGINAL ULTRASOUND OF PELVIS  DOPPLER ULTRASOUND OF OVARIES  TECHNIQUE: Transvaginal ultrasound examination of the pelvis was performed including evaluation of the uterus, ovaries, adnexal regions, and pelvic cul-de-sac.  Color and duplex Doppler ultrasound was utilized to evaluate blood flow to the ovaries.  COMPARISON:  01/20/2014  FINDINGS: There is prior hysterectomy and left oophorectomy  Right ovary  Measurements: 3.2 x 3.4 x 3.5 cm. The mildly complex cyst persists, measuring 2.4 x 2.4 x 3.0 cm. It contains a thin septation and  there is an internal fluid fluid level suggesting hemorrhage. There is an equivocal nodule measuring 1.3 cm. Overall the appearances of the cysts have changed only to the extent that there now is a defined fluid fluid level internally.  Pulsed Doppler evaluation demonstrates normal arterial and venous waveforms in the right ovary.  IMPRESSION: Persistent mildly complex right ovarian cyst. Based on the internal fluid fluid level, this most likely is a hemorrhagic cyst. Recommend continued follow-up in 6-8 weeks.   Electronically Signed   By: Andreas Newport M.D.   On: 02/22/2014 03:06    Assessment and Plan   1. Cyst of left ovary   2. Pelvic pain in female    Return to MAU as needed FU [place] in 6 weeks  FU in the clinic in about 8 weeks  Follow-up Information    Follow up with [facility].   Specialty:  Obstetrics and Gynecology   Why:  They will call you with an appointment    Contact information:   [place] [phone]      Follow up with [facility].   Why:  They will call you for a repeat US in 6 weeks        Mathis Bud 02/22/2014, 2:05 AM

## 2014-02-22 NOTE — ED Notes (Signed)
Pt report sudden onset  RLQ abd pain onset at 65 states hx of same January this year

## 2014-02-22 NOTE — ED Provider Notes (Addendum)
CSN: [id number]     Arrival date & time 02/22/14  0016 History  This chart was scribed for Mary Fines, MD by Edison Simon, ED Scribe. This patient was seen in room MH03/MH03 and the patient's care was started at 12:42 AM.    Chief Complaint  Patient presents with  . Abdominal Pain   The history is provided by the patient. No language interpreter was used.    HPI Comments: Mary Armstrong is a 44 y.o. female who presents to the Emergency Department complaining of right suprapubic area abdominal pain with sudden onset at 72 yesterday evening. She describes it as stabbing, stretching and moving. She characterizes it as similar to prior pain from ovarian cyst but more severe. She was diagnosed with ovarian cyst by ultrasound in January 20 2014. She was seen 1 week ago by [facility] and has follow up ultrasound scheduled which she has not had yet. Pain is severe enough to have her in tears. Pain is worse with movement or palpation. She denies nausea, vomiting, vaginal bleeding or vaginal discharge.  Past Medical History  Diagnosis Date  . Menorrhagia 04/19/2011  . Genital herpes   . Stroke     "light stroke" age 40 or 60yrs  . Anemia    Past Surgical History  Procedure Laterality Date  . Myomectomy  2005  . Myomectomy N/A 07/14/2013    Procedure: MYOMECTOMY;  Surgeon: Osborne Oman, MD;  Location: [place] [facility];  Service: Gynecology;  Laterality: N/A;  . Supracervical abdominal hysterectomy N/A 07/14/2013    Procedure: HYSTERECTOMY SUPRACERVICAL ABDOMINAL;  Surgeon: Osborne Oman, MD;  Location: [place] [facility];  Service: Gynecology;  Laterality: N/A;  . Salpingoophorectomy Left 07/14/2013    Procedure: SALPINGO OOPHORECTOMY;  Surgeon: Osborne Oman, MD;  Location: [place] [facility];  Service: Gynecology;  Laterality: Left;  . Lysis of adhesion N/A 07/14/2013    Procedure: EXTENSIVE LYSIS OF ADHESIONS, ENTERO LYSIS OF ADHESIONS;  Surgeon: Osborne Oman, MD;  Location: [place] [facility];  Service: Gynecology;  Laterality: N/A;   . Abdominal hysterectomy     Family History  Problem Relation Age of Onset  . Hypertension Mother     Living, 81  . Thyroid disease Mother   . Healthy Brother     Living, 5  . Breast cancer Maternal Grandmother     Died, 68  . Heart disease Maternal Grandmother    History  Substance Use Topics  . Smoking status: Never Smoker   . Smokeless tobacco: Never Used  . Alcohol Use: Yes     Comment: social use   OB History    Gravida Para Term Preterm AB TAB SAB Ectopic Multiple Living   5 0   5 3 1 1   0     Review of Systems A complete 10 system review of systems was obtained and all systems are negative except as noted in the HPI and PMH.    Allergies  Review of patient's allergies indicates no known allergies.  Home Medications   Prior to Admission medications   Medication Sig Start Date End Date Taking? Authorizing Provider  docusate sodium 100 MG CAPS Take 100 mg by mouth 2 (two) times daily as needed for mild constipation. Patient not taking: Reported on 02/10/2014 07/16/13   Osborne Oman, MD  ibuprofen (ADVIL,MOTRIN) 600 MG tablet Take 600 mg by mouth every 6 (six) hours as needed.    Historical Provider, MD  naproxen (NAPROSYN) 500 MG tablet Take 1 tablet (500  mg total) by mouth 2 (two) times daily. 01/20/14   Tatyana A Kirichenko, PA-C  Oxycodone-Acetaminophen (PERCOCET PO) Take by mouth.    Historical Provider, MD  traMADol (ULTRAM) 50 MG tablet Take 1 tablet (50 mg total) by mouth every 6 (six) hours as needed. Patient not taking: Reported on 02/10/2014 01/20/14   Tatyana A Kirichenko, PA-C  valACYclovir (VALTREX) 500 MG tablet Take 1 tablet (500 mg total) by mouth 2 (two) times daily. Patient not taking: Reported on 02/10/2014 07/16/13   Osborne Oman, MD   BP 105/63 mmHg  Pulse 62  Temp(Src) 98.6 F (37 C) (Oral)  Resp 20  Ht 5\' 2"  (1.575 m)  Wt 146 lb (66.225 kg)  BMI 26.70 kg/m2  SpO2 100%  LMP 06/03/2013   Physical Exam  Nursing note and vitals  reviewed. General: Well-developed, well-nourished female in no acute distress; appearance consistent with age of record HENT: normocephalic; atraumatic Eyes: pupils equal, round and reactive to light; extraocular muscles intact Neck: supple Heart: regular rate and rhythm Lungs: clear to auscultation bilaterally Abdomen: soft; nondistended; right suprapubic tenderness; no masses or hepatosplenomegaly; bowel sounds present Extremities: No deformity; full range of motion; pulses normal Neurologic: Awake, alert and oriented; motor function intact in all extremities and symmetric; no facial droop Skin: Warm and dry Psychiatric: Tearful   ED Course  Procedures (including critical care time)  DIAGNOSTIC STUDIES: Oxygen Saturation is 100% on room air, normal by my interpretation.    COORDINATION OF CARE: 12:46 AM Discussed treatment plan with patient at beside, the patient agrees with the plan and has no further questions at this time.    MDM  12:55 AM Is a concern for torsion the patient needs an emergent pelvic ultrasound. We do not have ultrasound available this time. Patient will be transferred to [facility] by private vehicle. This was discussed with Nira Conn, the [facility] MAU nurse practitioner on-call who will see the patient on arrival.  Nursing notes and vitals signs, including pulse oximetry, reviewed.  Summary of this visit's results, reviewed by myself:  Labs:  Results for orders placed or performed during the hospital encounter of 02/22/14 (from the past 24 hour(s))  Urinalysis, Routine w reflex microscopic     Status: Abnormal   Collection Time: 02/22/14 12:51 AM  Result Value Ref Range   Color, Urine YELLOW YELLOW   APPearance CLEAR CLEAR   Specific Gravity, Urine 1.010 1.005 - 1.030   pH 5.5 5.0 - 8.0   Glucose, UA NEGATIVE NEGATIVE mg/dL   Hgb urine dipstick TRACE (A) NEGATIVE   Bilirubin Urine NEGATIVE NEGATIVE   Ketones, ur NEGATIVE NEGATIVE mg/dL    Protein, ur NEGATIVE NEGATIVE mg/dL   Urobilinogen, UA 0.2 0.0 - 1.0 mg/dL   Nitrite NEGATIVE NEGATIVE   Leukocytes, UA NEGATIVE NEGATIVE  Urine microscopic-add on     Status: Abnormal   Collection Time: 02/22/14 12:51 AM  Result Value Ref Range   Squamous Epithelial / LPF MANY (A) RARE   WBC, UA 0-2 <3 WBC/hpf   RBC / HPF 0-2 <3 RBC/hpf   Bacteria, UA RARE RARE    Final diagnoses:  Pelvic pain in female   I personally performed the services described in this documentation, which was scribed in my presence. The recorded information has been reviewed and is accurate.   Mary Fines, MD 02/22/14 0867  Mary Fines, MD 02/22/14 [phone]

## 2014-02-22 NOTE — Discharge Instructions (Signed)
Ovarian Cyst An ovarian cyst is a fluid-filled sac that forms on an ovary. The ovaries are small organs that produce eggs in women. Various types of cysts can form on the ovaries. Most are not cancerous. Many do not cause problems, and they often go away on their own. Some may cause symptoms and require treatment. Common types of ovarian cysts include:  Functional cysts--These cysts may occur every month during the menstrual cycle. This is normal. The cysts usually go away with the next menstrual cycle if the woman does not get pregnant. Usually, there are no symptoms with a functional cyst.  Endometrioma cysts--These cysts form from the tissue that lines the uterus. They are also called "chocolate cysts" because they become filled with blood that turns brown. This type of cyst can cause pain in the lower abdomen during intercourse and with your menstrual period.  Cystadenoma cysts--This type develops from the cells on the outside of the ovary. These cysts can get very big and cause lower abdomen pain and pain with intercourse. This type of cyst can twist on itself, cut off its blood supply, and cause severe pain. It can also easily rupture and cause a lot of pain.  Dermoid cysts--This type of cyst is sometimes found in both ovaries. These cysts may contain different kinds of body tissue, such as skin, teeth, hair, or cartilage. They usually do not cause symptoms unless they get very big.  Theca lutein cysts--These cysts occur when too much of a certain hormone (human chorionic gonadotropin) is produced and overstimulates the ovaries to produce an egg. This is most common after procedures used to assist with the conception of a baby (in vitro fertilization). CAUSES   Fertility drugs can cause a condition in which multiple large cysts are formed on the ovaries. This is called ovarian hyperstimulation syndrome.  A condition called polycystic ovary syndrome can cause hormonal imbalances that can lead to  nonfunctional ovarian cysts. SIGNS AND SYMPTOMS  Many ovarian cysts do not cause symptoms. If symptoms are present, they may include:  Pelvic pain or pressure.  Pain in the lower abdomen.  Pain during sexual intercourse.  Increasing girth (swelling) of the abdomen.  Abnormal menstrual periods.  Increasing pain with menstrual periods.  Stopping having menstrual periods without being pregnant. DIAGNOSIS  These cysts are commonly found during a routine or annual pelvic exam. Tests may be ordered to find out more about the cyst. These tests may include:  Ultrasound.  X-ray of the pelvis.  CT scan.  MRI.  Blood tests. TREATMENT  Many ovarian cysts go away on their own without treatment. Your health care provider may want to check your cyst regularly for 2-3 months to see if it changes. For women in menopause, it is particularly important to monitor a cyst closely because of the higher rate of ovarian cancer in menopausal women. When treatment is needed, it may include any of the following:  A procedure to drain the cyst (aspiration). This may be done using a long needle and ultrasound. It can also be done through a laparoscopic procedure. This involves using a thin, lighted tube with a tiny camera on the end (laparoscope) inserted through a small incision.  Surgery to remove the whole cyst. This may be done using laparoscopic surgery or an open surgery involving a larger incision in the lower abdomen.  Hormone treatment or birth control pills. These methods are sometimes used to help dissolve a cyst. HOME CARE INSTRUCTIONS   Only take over-the-counter   or prescription medicines as directed by your health care provider.  Follow up with your health care provider as directed.  Get regular pelvic exams and Pap tests. SEEK MEDICAL CARE IF:   Your periods are late, irregular, or painful, or they stop.  Your pelvic pain or abdominal pain does not go away.  Your abdomen becomes  larger or swollen.  You have pressure on your bladder or trouble emptying your bladder completely.  You have pain during sexual intercourse.  You have feelings of fullness, pressure, or discomfort in your stomach.  You lose weight for no apparent reason.  You feel generally ill.  You become constipated.  You lose your appetite.  You develop acne.  You have an increase in body and facial hair.  You are gaining weight, without changing your exercise and eating habits.  You think you are pregnant. SEEK IMMEDIATE MEDICAL CARE IF:   You have increasing abdominal pain.  You feel sick to your stomach (nauseous), and you throw up (vomit).  You develop a fever that comes on suddenly.  You have abdominal pain during a bowel movement.  Your menstrual periods become heavier than usual. MAKE SURE YOU:  Understand these instructions.  Will watch your condition.  Will get help right away if you are not doing well or get worse. Document Released: 12/25/2004 Document Revised: 12/30/2012 Document Reviewed: 09/01/2012 ExitCare Patient Information 2015 [employer]. This information is not intended to replace advice given to you by your health care provider. Make sure you discuss any questions you have with your health care provider.  

## 2014-03-01 ENCOUNTER — Telehealth: Payer: Self-pay | Admitting: *Deleted

## 2014-03-01 DIAGNOSIS — R102 Pelvic and perineal pain: Secondary | ICD-10-CM

## 2014-03-01 MED ORDER — IBUPROFEN 800 MG PO TABS: 800.0000 mg | ORAL_TABLET | Freq: Three times a day (TID) | ORAL | Status: DC | PRN

## 2014-03-01 NOTE — Telephone Encounter (Signed)
Patient called and stated that she is calling about a prescription.

## 2014-03-01 NOTE — Telephone Encounter (Signed)
Spoke with Dr. Roselie Awkward he stated that patient can have Ibuprofen 800mg  tid #30 with no refills. Patient aware, rx sent to pharmacy.

## 2014-03-01 NOTE — Telephone Encounter (Signed)
Returned patients call no answer, left message for her to call us back and leave detailed message about what she is needing.

## 2014-03-03 ENCOUNTER — Ambulatory Visit (HOSPITAL_COMMUNITY): Payer: BLUE CROSS/BLUE SHIELD

## 2014-03-11 ENCOUNTER — Encounter: Payer: Self-pay | Admitting: Family

## 2014-03-11 ENCOUNTER — Ambulatory Visit (INDEPENDENT_AMBULATORY_CARE_PROVIDER_SITE_OTHER): Payer: BLUE CROSS/BLUE SHIELD | Admitting: Family

## 2014-03-11 VITALS — BP 105/69 | HR 78 | Temp 98.9°F | Wt 144.6 lb

## 2014-03-11 DIAGNOSIS — N8320 Unspecified ovarian cysts: Secondary | ICD-10-CM | POA: Diagnosis not present

## 2014-03-11 DIAGNOSIS — N83209 Unspecified ovarian cyst, unspecified side: Secondary | ICD-10-CM | POA: Insufficient documentation

## 2014-03-11 DIAGNOSIS — N83201 Unspecified ovarian cyst, right side: Secondary | ICD-10-CM

## 2014-03-11 NOTE — Progress Notes (Signed)
Subjective:     Patient ID: Mary Armstrong, female   DOB: May 28, 1970, 44 y.o.   MRN: [id number]  HPI Pt is a 44 y.o. African American woman [id number] here with report of pelvic pain x two months.  Pain started on RLQ in January.  Hx of hysterectomy due to fibroids.    Seen in MAU and diagnosed with hemorrhagic cyst.  Ultrasound showed the following: IMPRESSION: Persistent mildly complex right ovarian cyst. The mildly complex cyst persists,measuring 2.4 x 2.4 x 3.0 cm. It contains a thin septation and there is an internal fluid fluid level suggesting hemorrhageBased on the internal fluid fluid level, this most likely is a hemorrhagic cyst. Recommend continued follow-up in 6-8 weeks.  Pt discharged home with ibuprofen which relieves the pain.  Pain is intermittent and described as sharp in nature.  Pain rated a 2/10.    Review of Systems  Genitourinary: Positive for pelvic pain (intermittent).  All other systems reviewed and are negative.      Objective:   Physical Exam  Constitutional: She is oriented to person, place, and time. She appears well-developed and well-nourished. No distress.  HENT:  Head: Normocephalic and atraumatic.  Neck: Normal range of motion. Neck supple.  Cardiovascular: Normal rate, regular rhythm and normal heart sounds.   Pulmonary/Chest: Effort normal and breath sounds normal.  Abdominal: Soft. Bowel sounds are normal. There is no tenderness.  Genitourinary: Right adnexum displays no mass and no tenderness. Left adnexum displays no mass and no tenderness.  Neurological: She is alert and oriented to person, place, and time.  Skin: Skin is warm and dry.       Assessment:     Right Ovarian Cyst     Plan:     Continue ibuprofen for pain Follow-up ultrasound in April Follow-up sooner if pain worsens Gwen Pounds, CNM

## 2014-04-01 ENCOUNTER — Telehealth: Payer: Self-pay | Admitting: General Practice

## 2014-04-01 ENCOUNTER — Other Ambulatory Visit: Payer: Self-pay | Admitting: Obstetrics & Gynecology

## 2014-04-01 DIAGNOSIS — R102 Pelvic and perineal pain: Secondary | ICD-10-CM

## 2014-04-01 MED ORDER — IBUPROFEN 800 MG PO TABS: 800.0000 mg | ORAL_TABLET | Freq: Three times a day (TID) | ORAL | Status: DC | PRN

## 2014-04-01 NOTE — Telephone Encounter (Signed)
Patient called and left message stating she is still having bad pain and would like a prescription for ibuprofen. Spoke to Hansel Feinstein who agreed to refill patient's ibuprofen. Called patient, no answer- left message stating we are trying to return your call, we did get your medication refilled and sent to your pharmacy. Please call us back at the clinics if you still need assistance

## 2014-04-12 ENCOUNTER — Ambulatory Visit (HOSPITAL_COMMUNITY): Payer: BLUE CROSS/BLUE SHIELD

## 2014-04-19 ENCOUNTER — Inpatient Hospital Stay (HOSPITAL_COMMUNITY)
Admission: AD | Admit: 2014-04-19 | Discharge: 2014-04-19 | Disposition: A | Discharge status: Home / Self Care | Payer: BLUE CROSS/BLUE SHIELD | Source: Ambulatory Visit | Attending: Obstetrics and Gynecology | Admitting: Obstetrics and Gynecology

## 2014-04-19 ENCOUNTER — Inpatient Hospital Stay (HOSPITAL_COMMUNITY): Payer: BLUE CROSS/BLUE SHIELD

## 2014-04-19 ENCOUNTER — Encounter (HOSPITAL_COMMUNITY): Payer: Self-pay | Admitting: *Deleted

## 2014-04-19 DIAGNOSIS — Z8742 Personal history of other diseases of the female genital tract: Secondary | ICD-10-CM

## 2014-04-19 DIAGNOSIS — R1031 Right lower quadrant pain: Secondary | ICD-10-CM | POA: Insufficient documentation

## 2014-04-19 DIAGNOSIS — Z90710 Acquired absence of both cervix and uterus: Secondary | ICD-10-CM | POA: Insufficient documentation

## 2014-04-19 LAB — CBC WITH DIFFERENTIAL/PLATELET
BASOS ABS: 0 10*3/uL (ref 0.0–0.1)
Basophils Relative: 1 % (ref 0–1)
EOS ABS: 0.1 10*3/uL (ref 0.0–0.7)
EOS PCT: 2 % (ref 0–5)
HCT: 35.1 % — ABNORMAL LOW (ref 36.0–46.0)
HEMOGLOBIN: 12 g/dL (ref 12.0–15.0)
LYMPHS PCT: 37 % (ref 12–46)
Lymphs Abs: 2.2 10*3/uL (ref 0.7–4.0)
MCH: 32.4 pg (ref 26.0–34.0)
MCHC: 34.2 g/dL (ref 30.0–36.0)
MCV: 94.9 fL (ref 78.0–100.0)
Monocytes Absolute: 0.6 10*3/uL (ref 0.1–1.0)
Monocytes Relative: 10 % (ref 3–12)
NEUTROS ABS: 3.1 10*3/uL (ref 1.7–7.7)
NEUTROS PCT: 50 % (ref 43–77)
Platelets: 260 10*3/uL (ref 150–400)
RBC: 3.7 MIL/uL — ABNORMAL LOW (ref 3.87–5.11)
RDW: 13.6 % (ref 11.5–15.5)
WBC: 6 10*3/uL (ref 4.0–10.5)

## 2014-04-19 LAB — URINALYSIS, ROUTINE W REFLEX MICROSCOPIC
Bilirubin Urine: NEGATIVE
Glucose, UA: NEGATIVE mg/dL
KETONES UR: NEGATIVE mg/dL
Leukocytes, UA: NEGATIVE
NITRITE: NEGATIVE
Protein, ur: NEGATIVE mg/dL
SPECIFIC GRAVITY, URINE: 1.025 (ref 1.005–1.030)
UROBILINOGEN UA: 0.2 mg/dL (ref 0.0–1.0)
pH: 5.5 (ref 5.0–8.0)

## 2014-04-19 LAB — URINE MICROSCOPIC-ADD ON

## 2014-04-19 MED ORDER — IBUPROFEN 800 MG PO TABS: 800.0000 mg | ORAL_TABLET | Freq: Three times a day (TID) | ORAL | Status: DC

## 2014-04-19 NOTE — MAU Note (Signed)
Severe RLQ since yesterday, ibuprofen is not working.  Comes & goes every 2 hours.  Denies bleeding.  One episode of vomiting & diarrhea this morning.

## 2014-04-19 NOTE — MAU Provider Note (Signed)
History   pt in with mod RLQ pain that started about 1400 yesterday. surg hx pos for hyst anf L ooph. Pt has documented hx of hemmoragic rt ovarian cyst in feb and was to get follow up u/s and failed her appt for scan.  CSN: [id number]  Arrival date and time: 04/19/14 1141   First Provider Initiated Contact with Patient 04/19/14 1555      Chief Complaint  Patient presents with  . Abdominal Pain   HPI  Pertinent Gynecological History: Menses: hyst Bleeding: none Contraception: status post hysterectomy DES exposure: unknown Blood transfusions: none Sexually transmitted diseases: no past history Previous GYN Procedures: hyst and l ooph  Last mammogram: normal Date: unsure Last pap: n/a Date: n/a   Past Medical History  Diagnosis Date  . Menorrhagia 04/19/2011  . Genital herpes   . Stroke     "light stroke" age 10 or 30yrs  . Anemia     Past Surgical History  Procedure Laterality Date  . Myomectomy  2005  . Myomectomy N/A 07/14/2013    Procedure: MYOMECTOMY;  Surgeon: Osborne Oman, MD;  Location: [facility];  Service: Gynecology;  Laterality: N/A;  . Supracervical abdominal hysterectomy N/A 07/14/2013    Procedure: HYSTERECTOMY SUPRACERVICAL ABDOMINAL;  Surgeon: Osborne Oman, MD;  Location: [facility];  Service: Gynecology;  Laterality: N/A;  . Salpingoophorectomy Left 07/14/2013    Procedure: SALPINGO OOPHORECTOMY;  Surgeon: Osborne Oman, MD;  Location: [facility];  Service: Gynecology;  Laterality: Left;  . Lysis of adhesion N/A 07/14/2013    Procedure: EXTENSIVE LYSIS OF ADHESIONS, ENTERO LYSIS OF ADHESIONS;  Surgeon: Osborne Oman, MD;  Location: [facility];  Service: Gynecology;  Laterality: N/A;  . Abdominal hysterectomy      Family History  Problem Relation Age of Onset  . Hypertension Mother     Living, 10  . Thyroid disease Mother   . Healthy Brother     Living, 66  . Breast cancer Maternal Grandmother     Died, 68  . Heart disease Maternal Grandmother      History  Substance Use Topics  . Smoking status: Never Smoker   . Smokeless tobacco: Never Used  . Alcohol Use: Yes     Comment: social use    Allergies: No Known Allergies  Prescriptions prior to admission  Medication Sig Dispense Refill Last Dose  . ibuprofen (ADVIL,MOTRIN) 800 MG tablet Take 1 tablet (800 mg total) by mouth every 8 (eight) hours as needed for moderate pain. 30 tablet 0   . Oxycodone-Acetaminophen (PERCOCET PO) Take by mouth.   Not Taking    Review of Systems  Constitutional: Negative.   HENT: Negative.   Eyes: Negative.   Respiratory: Negative.   Cardiovascular: Negative.   Gastrointestinal: Positive for nausea and abdominal pain.  Genitourinary: Negative.   Musculoskeletal: Negative.   Skin: Negative.   Neurological: Negative.   Endo/Heme/Allergies: Negative.   Psychiatric/Behavioral: Negative.    Physical Exam   Blood pressure 92/57, pulse 61, temperature 98.3 F (36.8 C), temperature source Oral, resp. rate 18, height 5\' 2"  (1.575 m), weight 147 lb (66.679 kg), last menstrual period 06/03/2013.  Physical Exam  Constitutional: She is oriented to person, place, and time. She appears well-developed and well-nourished.  HENT:  Head: Normocephalic.  Eyes: Pupils are equal, round, and reactive to light.  Neck: Normal range of motion.  Cardiovascular: Normal rate, regular rhythm, normal heart sounds and intact distal pulses.   Respiratory: Effort normal and  breath sounds normal.  GI: Bowel sounds are normal.  R lower quad tenderness with palp.  Genitourinary: Vagina normal.  Musculoskeletal: Normal range of motion.  Neurological: She is alert and oriented to person, place, and time. She has normal reflexes.  Skin: Skin is warm and dry.  Psychiatric: She has a normal mood and affect. Her behavior is normal. Judgment and thought content normal.    MAU Course  Procedures  MDM pelivic u/s and cbc with diff to R/O appendix.  Assessment and  Plan  Probable R ovarian cyst. Resolving ovarian cyst per u/s. Pt states pain is better. Will d/c home.  Koren Shiver DARLENE 04/19/2014, 3:55 PM

## 2014-05-12 ENCOUNTER — Emergency Department (HOSPITAL_BASED_OUTPATIENT_CLINIC_OR_DEPARTMENT_OTHER): Payer: BLUE CROSS/BLUE SHIELD

## 2014-05-12 ENCOUNTER — Encounter (HOSPITAL_BASED_OUTPATIENT_CLINIC_OR_DEPARTMENT_OTHER): Payer: Self-pay | Admitting: *Deleted

## 2014-05-12 ENCOUNTER — Emergency Department (HOSPITAL_BASED_OUTPATIENT_CLINIC_OR_DEPARTMENT_OTHER)
Admission: EM | Admit: 2014-05-12 | Discharge: 2014-05-12 | Disposition: A | Discharge status: Home / Self Care | Payer: BLUE CROSS/BLUE SHIELD | Attending: Emergency Medicine | Admitting: Emergency Medicine

## 2014-05-12 DIAGNOSIS — Z8673 Personal history of transient ischemic attack (TIA), and cerebral infarction without residual deficits: Secondary | ICD-10-CM | POA: Insufficient documentation

## 2014-05-12 DIAGNOSIS — M25562 Pain in left knee: Secondary | ICD-10-CM | POA: Insufficient documentation

## 2014-05-12 DIAGNOSIS — Z791 Long term (current) use of non-steroidal anti-inflammatories (NSAID): Secondary | ICD-10-CM | POA: Insufficient documentation

## 2014-05-12 DIAGNOSIS — Z8742 Personal history of other diseases of the female genital tract: Secondary | ICD-10-CM | POA: Diagnosis not present

## 2014-05-12 DIAGNOSIS — Z862 Personal history of diseases of the blood and blood-forming organs and certain disorders involving the immune mechanism: Secondary | ICD-10-CM | POA: Insufficient documentation

## 2014-05-12 MED ORDER — IBUPROFEN 800 MG PO TABS
800.0000 mg | ORAL_TABLET | Freq: Once | ORAL | Status: AC
  Administered 2014-05-12: 800 mg via ORAL
  Filled 2014-05-12: qty 1

## 2014-05-12 NOTE — ED Notes (Signed)
EDP completed sedation procedure and was notified of pt request

## 2014-05-12 NOTE — ED Notes (Signed)
Pt c/o left knee pain x 1 day states lump behind left knee

## 2014-05-12 NOTE — Discharge Instructions (Signed)
Return to the ED with any concerns including increased pain, swelling/discoloration/numbness of foot or toes, or any other alarming symptoms °

## 2014-05-12 NOTE — ED Notes (Signed)
Pt to nse desk-? Results of [place] and need to leave-advised it did not show a blood clot-pt with more ?s-advised she would need to wait for EDP to review study-pt request work note stating she was going to leave-advised her the two options were to leave without d/c and RTW note or wait for EDP-pt states she will wait

## 2014-05-12 NOTE — ED Provider Notes (Signed)
CSN: [id number]     Arrival date & time 05/12/14  1844 History   First MD Initiated Contact with Patient 05/12/14 2034     Chief Complaint  Patient presents with  . Knee Pain     (Consider location/radiation/quality/duration/timing/severity/associated sxs/prior Treatment) HPI   PCP: SCHOENHOFF,DEBBIE, MD Blood pressure 106/68, pulse 68, temperature 98.4 F (36.9 C), temperature source Oral, resp. rate 18, height 5\' 2"  (1.575 m), weight 138 lb (62.596 kg), last menstrual period 06/03/2013, SpO2 100 %.  Mary Armstrong is a 44 y.o.female without any significant PMH presents to the ER with complaints of left knee pain that started acutely this morning. The pain is behind the left knee and she feels associated swelling. The pain is worse with standing and walking. Denies rash, injury, redness, numbness, weakness, change of color. She denies being on birth control. She denies tachycardia, SOB, CP, headache or general focal weakness.   Past Medical History  Diagnosis Date  . Menorrhagia 04/19/2011  . Genital herpes   . Stroke     "light stroke" age 55 or 15yrs  . Anemia    Past Surgical History  Procedure Laterality Date  . Myomectomy  2005  . Myomectomy N/A 07/14/2013    Procedure: MYOMECTOMY;  Surgeon: Osborne Oman, MD;  Location: [facility];  Service: Gynecology;  Laterality: N/A;  . Supracervical abdominal hysterectomy N/A 07/14/2013    Procedure: HYSTERECTOMY SUPRACERVICAL ABDOMINAL;  Surgeon: Osborne Oman, MD;  Location: [facility];  Service: Gynecology;  Laterality: N/A;  . Salpingoophorectomy Left 07/14/2013    Procedure: SALPINGO OOPHORECTOMY;  Surgeon: Osborne Oman, MD;  Location: [facility];  Service: Gynecology;  Laterality: Left;  . Lysis of adhesion N/A 07/14/2013    Procedure: EXTENSIVE LYSIS OF ADHESIONS, ENTERO LYSIS OF ADHESIONS;  Surgeon: Osborne Oman, MD;  Location: [facility];  Service: Gynecology;  Laterality: N/A;  . Abdominal hysterectomy     Family History  Problem  Relation Age of Onset  . Hypertension Mother     Living, 28  . Thyroid disease Mother   . Healthy Brother     Living, 27  . Breast cancer Maternal Grandmother     Died, 68  . Heart disease Maternal Grandmother    History  Substance Use Topics  . Smoking status: Never Smoker   . Smokeless tobacco: Never Used  . Alcohol Use: Yes     Comment: social use   OB History    Gravida Para Term Preterm AB TAB SAB Ectopic Multiple Living   5 0   5 3 1 1   0     Review of Systems   Review of Systems  Gen: no weight loss, fevers, chills, night sweats  Eyes: no occular draining, occular pain,  No visual changes  Nose: no epistaxis or rhinorrhea  Mouth: no dental pain, no sore throat  Neck: no neck pain  Lungs: No hemoptysis. No wheezing or coughing CV:  No palpitations, dependent edema or orthopnea. No chest pain Abd: no diarrhea. No nausea or vomiting, No abdominal pain  GU: no dysuria or gross hematuria  MSK:  No muscle weakness, + muscular pain and swelling Neuro: no headache, no focal neurologic deficits  Skin: no rash , no wounds Psyche: no complaints of depression or anxiety    Allergies  Review of patient's allergies indicates no known allergies.  Home Medications   Prior to Admission medications   Medication Sig Start Date End Date Taking? Authorizing Provider  ibuprofen (  ADVIL,MOTRIN) 800 MG tablet Take 1 tablet (800 mg total) by mouth every 8 (eight) hours as needed for moderate pain. 04/01/14   Seabron Spates, CNM  ibuprofen (ADVIL,MOTRIN) 800 MG tablet Take 1 tablet (800 mg total) by mouth 3 (three) times daily. 04/19/14   Keitha Butte, CNM   BP 106/68 mmHg  Pulse 68  Temp(Src) 98.4 F (36.9 C) (Oral)  Resp 18  Ht 5\' 2"  (1.575 m)  Wt 138 lb (62.596 kg)  BMI 25.23 kg/m2  SpO2 100%  LMP 06/03/2013 Physical Exam  Constitutional: She appears well-developed and well-nourished. No distress.  HENT:  Head: Normocephalic and atraumatic.  Eyes: Pupils are equal,  round, and reactive to light.  Neck: Normal range of motion. Neck supple.  Cardiovascular: Normal rate and regular rhythm.   Pulmonary/Chest: Effort normal.  Abdominal: Soft.  Musculoskeletal:       Left knee: She exhibits swelling. She exhibits normal range of motion, no effusion, no ecchymosis, no deformity, no laceration, no erythema, normal alignment, no LCL laxity, normal patellar mobility and no bony tenderness. Tenderness (posterior knee) found. No patellar tendon tenderness noted.  Pedal pulse is palpable. Foot is moist and warm, normal skin color.  Neurological: She is alert.  Skin: Skin is warm and dry.  Nursing note and vitals reviewed.   ED Course  Procedures (including critical care time) Labs Review Labs Reviewed - No data to display  Imaging Review No results found.   EKG Interpretation None      MDM   Final diagnoses:  None    Pt to obtain dopplers of the lower extremity to r/o DVT. If negative, she should have knee sleeve and NSAIDs for pain control with referral to Ortho.  At end of shift, pt hand off to Dr. Canary Brim. - pt in no distress, no CP, SOB or weakness.  Filed Vitals:   05/12/14 1852  BP: 106/68  Pulse: 68  Temp: 98.4 F (36.9 C)  Resp: [address] PA-C 05/12/14 2216  Alfonzo Beers, MD 05/12/14 [phone]

## 2014-05-14 ENCOUNTER — Telehealth: Payer: Self-pay | Admitting: General Practice

## 2014-05-14 DIAGNOSIS — N83209 Unspecified ovarian cyst, unspecified side: Secondary | ICD-10-CM

## 2014-05-14 MED ORDER — IBUPROFEN 800 MG PO TABS: 800.0000 mg | ORAL_TABLET | Freq: Three times a day (TID) | ORAL | Status: DC

## 2014-05-14 NOTE — Telephone Encounter (Signed)
Patient called and left message stating she is in severe pain and needs her motrin refilled. Called patient and she request refill on her ibuprofen 800 because it feels like the cysts are back and she is really hurting. Patient states she just took 4 ibuprofen at home and they didn't help but the 800mg  pills help. Spoke to Dr Roselie Awkward who authorized refill. Informed patient of refill sent to pharmacy. Patient verbalized understanding and had no other questions

## 2014-06-04 ENCOUNTER — Other Ambulatory Visit: Payer: Self-pay | Admitting: Obstetrics & Gynecology

## 2014-06-04 ENCOUNTER — Telehealth: Payer: Self-pay

## 2014-06-04 DIAGNOSIS — N83209 Unspecified ovarian cyst, unspecified side: Secondary | ICD-10-CM

## 2014-06-04 MED ORDER — IBUPROFEN 800 MG PO TABS: 800.0000 mg | ORAL_TABLET | Freq: Three times a day (TID) | ORAL | Status: DC

## 2014-06-04 NOTE — Telephone Encounter (Signed)
Patient called requesting refill Ibuprofen 800mg  tabs. Called patient and informed her she may take OTC ibuprofen (4) 200mg  tabs (800mg  total) up to 3 times/day. Patient states the doctor told her she could not do this-- explained to patient that it is the same dose. Patient requests RX. Dr. Elly Modena agreed to RX with 3 refills. Informed patient RX sent to pharmacy. No further questions or concerns.

## 2014-07-25 ENCOUNTER — Other Ambulatory Visit: Payer: Self-pay | Admitting: Obstetrics & Gynecology

## 2014-08-12 ENCOUNTER — Ambulatory Visit (INDEPENDENT_AMBULATORY_CARE_PROVIDER_SITE_OTHER): Payer: BLUE CROSS/BLUE SHIELD | Admitting: Obstetrics & Gynecology

## 2014-08-12 ENCOUNTER — Encounter: Payer: Self-pay | Admitting: Obstetrics & Gynecology

## 2014-08-12 VITALS — BP 105/61 | HR 60 | Ht 62.0 in | Wt 137.3 lb

## 2014-08-12 DIAGNOSIS — N8320 Unspecified ovarian cysts: Secondary | ICD-10-CM | POA: Diagnosis not present

## 2014-08-12 DIAGNOSIS — N83201 Unspecified ovarian cyst, right side: Secondary | ICD-10-CM

## 2014-08-12 MED ORDER — NORETHINDRONE ACET-ETHINYL EST 1-20 MG-MCG PO TABS: 1.0000 | ORAL_TABLET | Freq: Every day | ORAL | Status: DC

## 2014-08-12 NOTE — Progress Notes (Signed)
CLINIC ENCOUNTER NOTE  History:  44 y.o. [id number] here today for persistent right ovarian pain that has worsened over the past two weeks.  She has had recurrent physiologic right ovarian cysts since 01/2014 associated with pain.  Had Abdominal Myomectomy converted to Supracervical Hysterectomy, Left Salpingoophorectomy, Extensive Enterolysis and Lysis of Adhesions on 07/14/13.   Pain is centered in RLQ, can be constant and sharp in nature.   She denies any abnormal vaginal discharge, bleeding or other concerns.   Past Medical History  Diagnosis Date  . Genital herpes   . TIA (transient ischemic attack)     "light stroke" age 89 or 46yrs  . Anemia     Past Surgical History  Procedure Laterality Date  . Myomectomy  2005  . Myomectomy N/A 07/14/2013    Procedure: MYOMECTOMY;  Surgeon: Osborne Oman, MD;  Location: [facility];  Service: Gynecology;  Laterality: N/A;  . Supracervical abdominal hysterectomy N/A 07/14/2013    Procedure: HYSTERECTOMY SUPRACERVICAL ABDOMINAL;  Surgeon: Osborne Oman, MD;  Location: [facility];  Service: Gynecology;  Laterality: N/A;  . Salpingoophorectomy Left 07/14/2013    Procedure: SALPINGO OOPHORECTOMY;  Surgeon: Osborne Oman, MD;  Location: [facility];  Service: Gynecology;  Laterality: Left;  . Lysis of adhesion N/A 07/14/2013    Procedure: EXTENSIVE LYSIS OF ADHESIONS, ENTERO LYSIS OF ADHESIONS;  Surgeon: Osborne Oman, MD;  Location: [facility];  Service: Gynecology;  Laterality: N/A;  . Abdominal hysterectomy      The following portions of the patient's history were reviewed and updated as appropriate: allergies, current medications, past family history, past medical history, past social history, past surgical history and problem list.   Health Maintenance:  Normal mammogram in 12/2012.  Review of Systems:  Pertinent items are noted in HPI. Comprehensive review of systems was otherwise negative.  Objective:  Physical Exam BP 105/61 mmHg  Pulse 60  Ht 5'  2" (1.575 m)  Wt 137 lb 4.8 oz (62.279 kg)  BMI 25.11 kg/m2  LMP 06/03/2013 CONSTITUTIONAL: Well-developed, well-nourished female in no acute distress.  HENT:  Normocephalic, atraumatic. External right and left ear normal. Oropharynx is clear and moist EYES: Conjunctivae and EOM are normal. Pupils are equal, round, and reactive to light. No scleral icterus.  NECK: Normal range of motion, supple, no masses SKIN: Skin is warm and dry. No rash noted. Not diaphoretic. No erythema. No pallor. [place]: Alert and oriented to person, place, and time. Normal reflexes, muscle tone coordination. No cranial nerve deficit noted. PSYCHIATRIC: Normal mood and affect. Normal behavior. Normal judgment and thought content. CARDIOVASCULAR: Normal heart rate noted RESPIRATORY: Effort and breath sounds normal, no problems with respiration noted ABDOMEN: Soft, no distention noted.  Mild RLQ tenderness to palpation, no rebound, no guarding. PELVIC: Deferred MUSCULOSKELETAL: Normal range of motion. No edema noted.  Labs and Imaging 04/19/14 ULTRASOUND PELVIS TRANSVAGINAL CLINICAL DATA: Right lower quadrant pelvic pain. Followup right ovarian cyst COMPARISON: 02/22/2014 FINDINGS: Uterus  Measurements: Surgically absent. No midline mass Endometrium  Thickness: Surgically absent. Right ovary  Measurements: 3.6 x 2.3 x 2.3 cm. Decrease in size of previously seen dominant right ovarian cystic lesion versus previous resolution and interval development of other follicles and cystic lesions with internal echoes likely retractile peripheral clot, measuring 1.7 x 1.5 x 1.3 cm and 1.3 x 1.2 x 1.2 cm, respectively. Left ovary  Measurements: Surgically absent. No adnexal mass. Other findings: No free fluid IMPRESSION:  Within the right ovary, there are 2 cystic  lesions with internal echoes likely indicating physiologic resolving cysts, measuring smaller than previously. This could represent interval resolution of the  previously seen cyst or decrease in size. Given these likely physiologic findings, no further specific imaging followup is needed for this finding. No specific etiology is identified to explain the history of right lower quadrant abdominal pain. Electronically Signed  By: Conchita Paris M.D. On: 04/19/2014 16:33  Assessment & Plan:  Recurrent right ovarian cysts associated with pain.  Patient is considering oophorectomy but worried about surgical menopause as she it already getting vasomotor symptoms.  Given that the cysts are physiologic in nature, recommended trial of hormonal therapy for ovulation suppression (this may also help her vasomotor symptoms).  Patient agrees to trial of OCPs; Loestrin 24 Fe prescribed. Will monitor response. Repeat ultrasound ordered to evaluate for any further changes/anomalies; will follow up results and manage accordingly. Follow up in 2 months. Routine preventative health maintenance measures emphasized. Please refer to After Visit Summary for other counseling recommendations.    Verita Schneiders, MD, [place] Attending [place] for [employer], [place]

## 2014-08-12 NOTE — Patient Instructions (Signed)
Return to clinic for any obstetric concerns or go to MAU for evaluation  

## 2014-08-12 NOTE — Progress Notes (Signed)
Pt has been scheduled for Pelvic U/S on August 18 at 3:00pm at the [facility].

## 2014-08-26 ENCOUNTER — Ambulatory Visit (HOSPITAL_COMMUNITY)
Admission: RE | Admit: 2014-08-26 | Discharge: 2014-08-26 | Disposition: A | Discharge status: Home / Self Care | Payer: BLUE CROSS/BLUE SHIELD | Source: Ambulatory Visit | Attending: Obstetrics & Gynecology | Admitting: Obstetrics & Gynecology

## 2014-08-26 DIAGNOSIS — N8320 Unspecified ovarian cysts: Secondary | ICD-10-CM | POA: Diagnosis present

## 2014-08-26 DIAGNOSIS — N83201 Unspecified ovarian cyst, right side: Secondary | ICD-10-CM

## 2014-12-24 ENCOUNTER — Ambulatory Visit: Payer: BLUE CROSS/BLUE SHIELD | Admitting: Obstetrics & Gynecology

## 2015-01-10 ENCOUNTER — Other Ambulatory Visit: Payer: Self-pay | Admitting: Obstetrics and Gynecology

## 2015-04-10 IMAGING — CR DG ELBOW COMPLETE 3+V*R*
4 series · 4 of 4 positions shown · non-contrast
Comparison: None.

CLINICAL DATA: History of injury 2 weeks previously.  History of
pain.

RIGHT ELBOW - COMPLETE 3+ VIEW

[x elbow joint ap right]
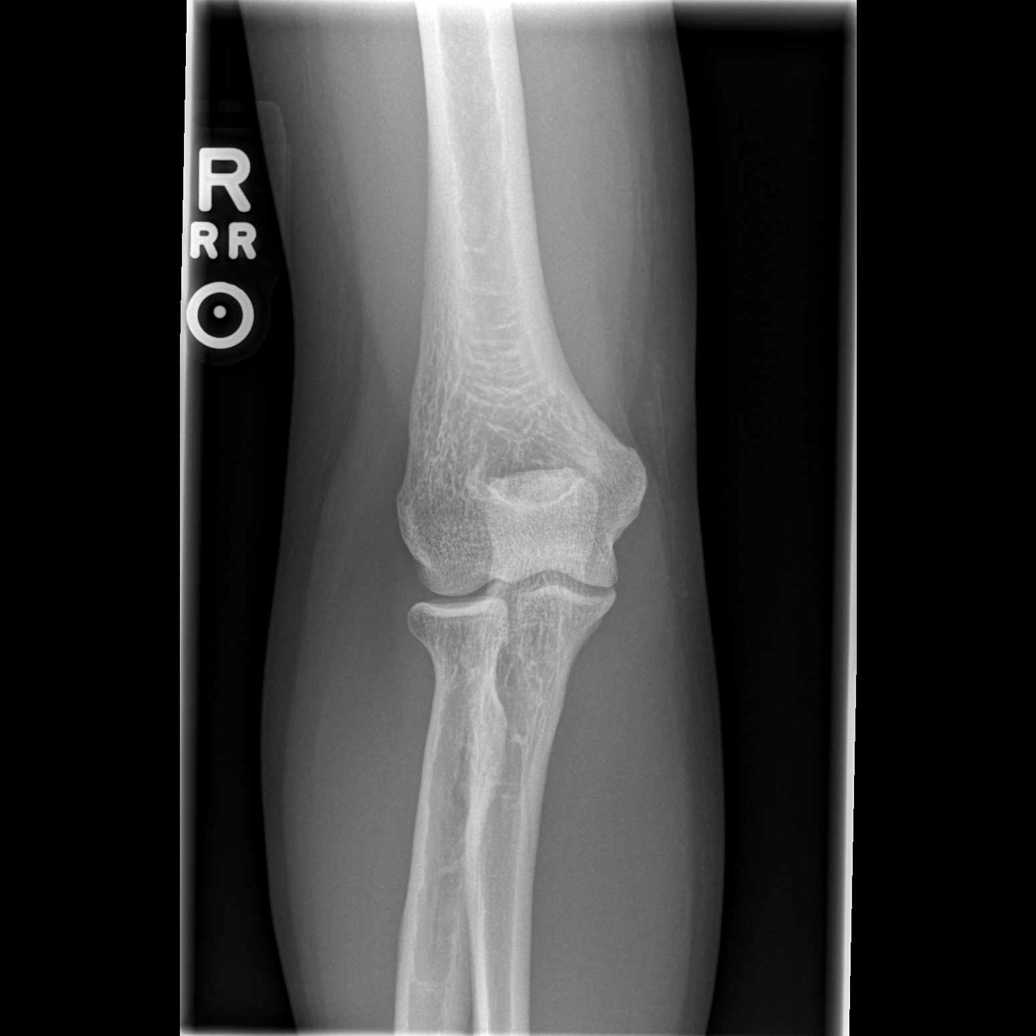

[x elbow joint obl. right (1 of 2)]
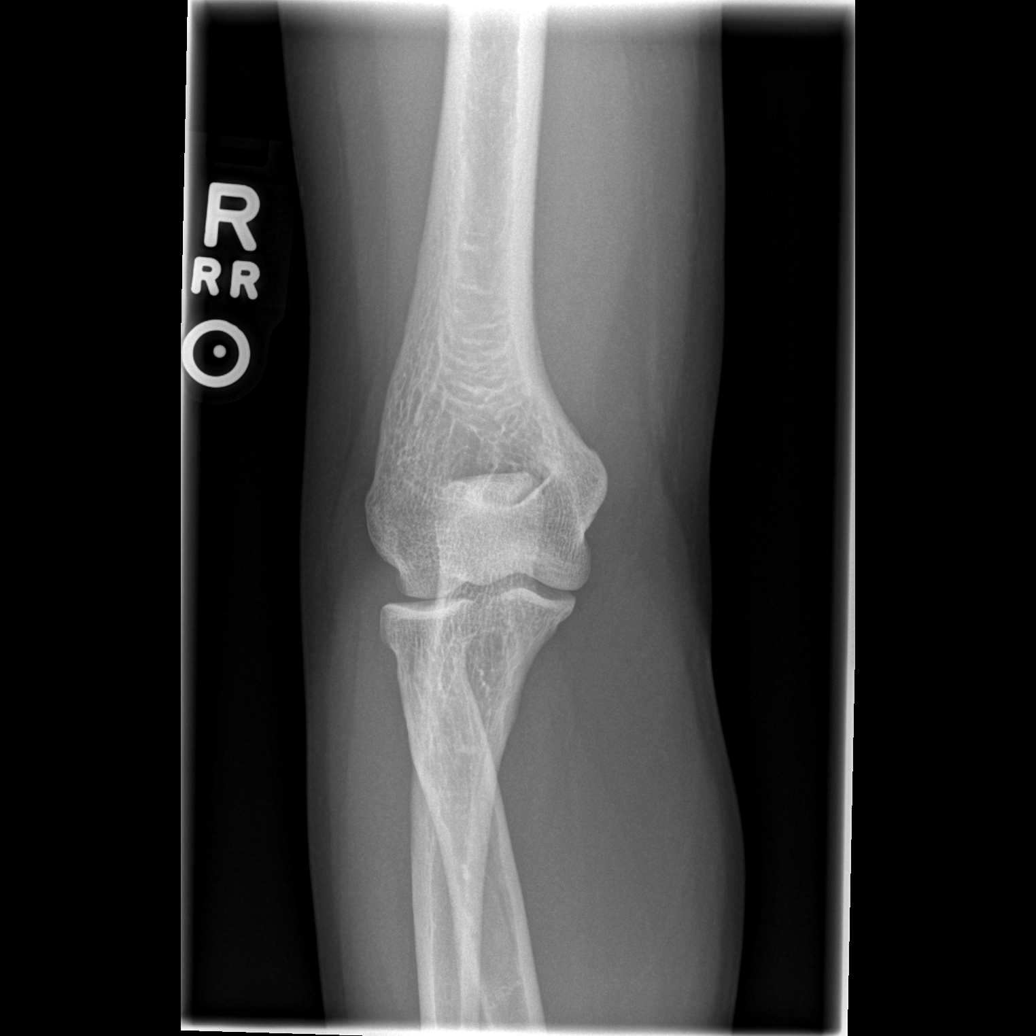

[x elbow joint obl. right (2 of 2)]
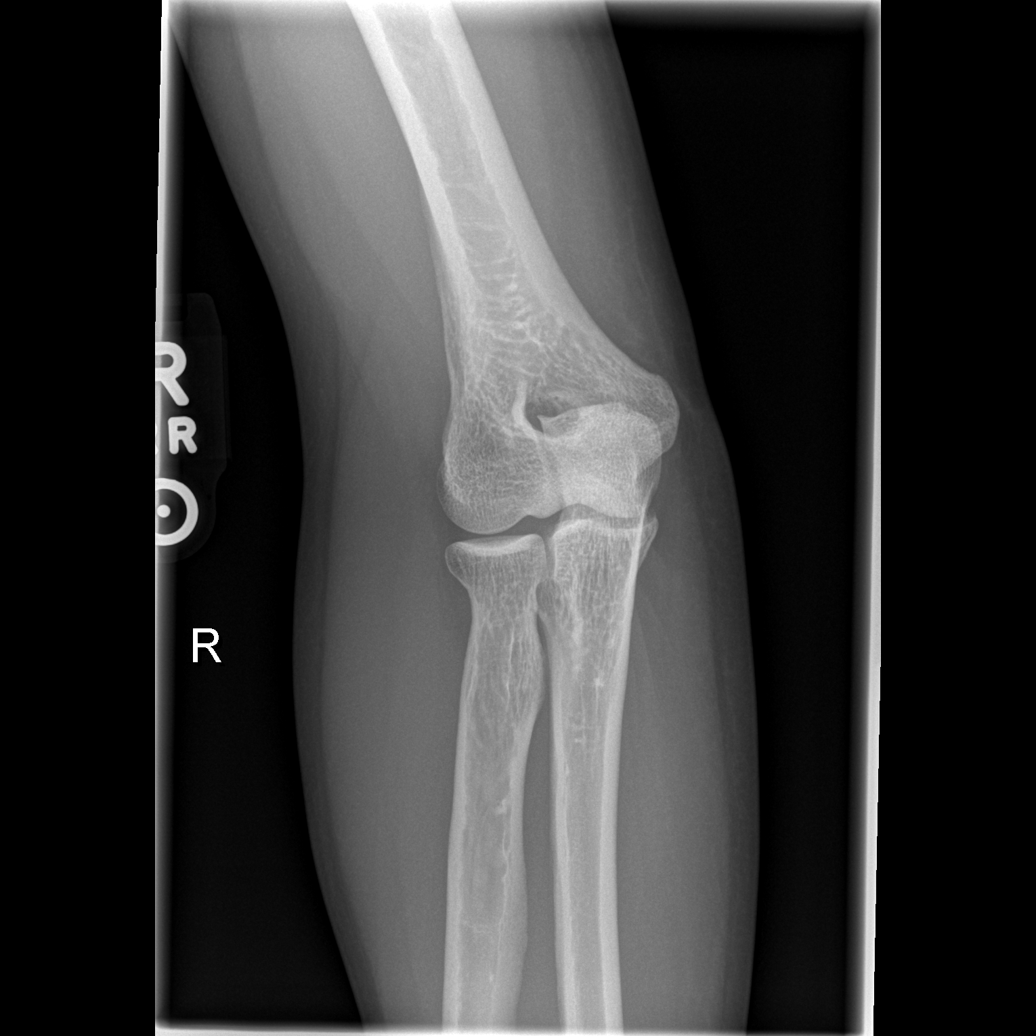

[x elbow joint lat right]
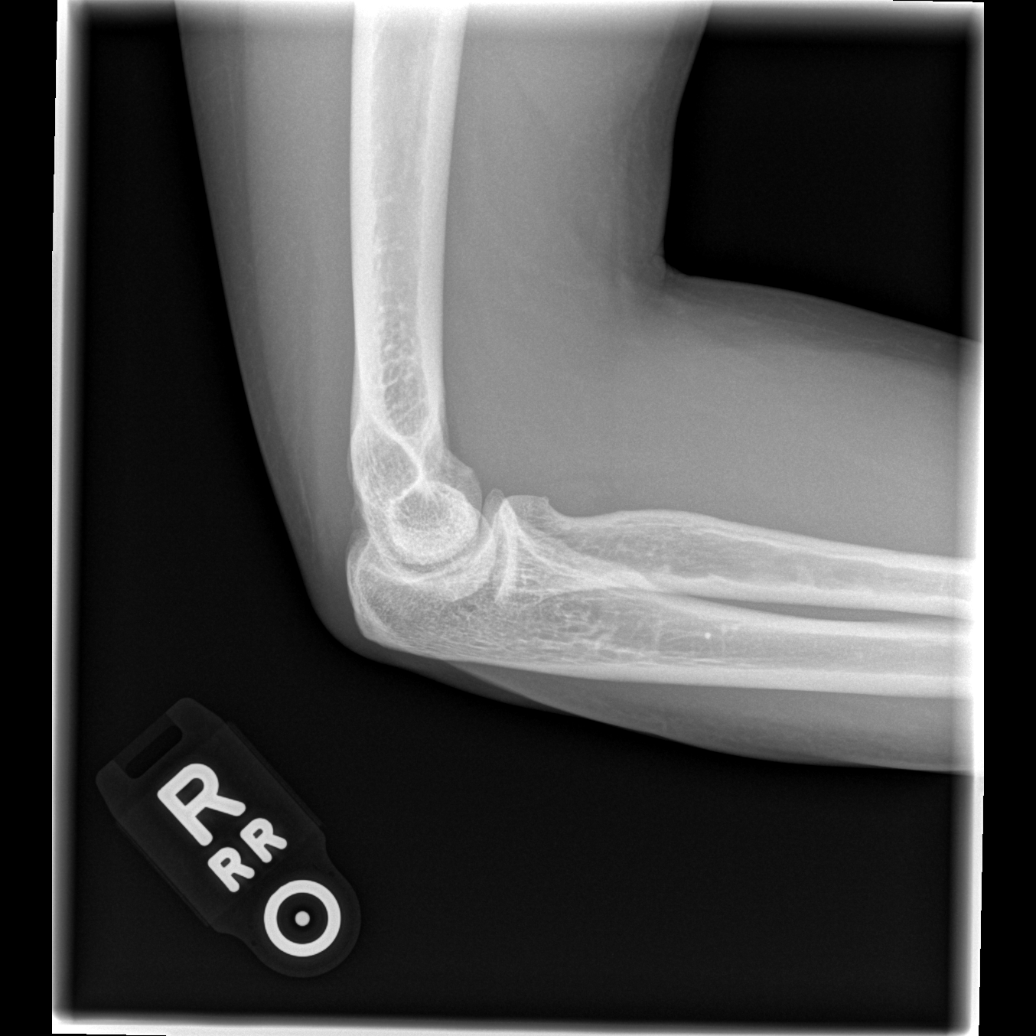

[4 of 4 positions shown; findings below may reference images not displayed]

FINDINGS: There is no evidence of positive fat pad sign to suggest
joint effusion.  No fracture or dislocation is evident.
IMPRESSION: No fracture or dislocation is evident.

## 2015-06-27 ENCOUNTER — Ambulatory Visit: Payer: BLUE CROSS/BLUE SHIELD | Admitting: Obstetrics & Gynecology

## 2015-07-12 ENCOUNTER — Emergency Department (HOSPITAL_BASED_OUTPATIENT_CLINIC_OR_DEPARTMENT_OTHER)
Admission: EM | Admit: 2015-07-12 | Discharge: 2015-07-12 | Disposition: A | Discharge status: Home / Self Care | Payer: BLUE CROSS/BLUE SHIELD | Attending: Emergency Medicine | Admitting: Emergency Medicine

## 2015-07-12 ENCOUNTER — Emergency Department (HOSPITAL_BASED_OUTPATIENT_CLINIC_OR_DEPARTMENT_OTHER): Payer: BLUE CROSS/BLUE SHIELD

## 2015-07-12 ENCOUNTER — Encounter (HOSPITAL_BASED_OUTPATIENT_CLINIC_OR_DEPARTMENT_OTHER): Payer: Self-pay | Admitting: *Deleted

## 2015-07-12 DIAGNOSIS — M25561 Pain in right knee: Secondary | ICD-10-CM | POA: Diagnosis not present

## 2015-07-12 DIAGNOSIS — M25562 Pain in left knee: Secondary | ICD-10-CM | POA: Insufficient documentation

## 2015-07-12 DIAGNOSIS — M25569 Pain in unspecified knee: Secondary | ICD-10-CM | POA: Diagnosis present

## 2015-07-12 DIAGNOSIS — Z792 Long term (current) use of antibiotics: Secondary | ICD-10-CM | POA: Diagnosis not present

## 2015-07-12 DIAGNOSIS — Z79818 Long term (current) use of other agents affecting estrogen receptors and estrogen levels: Secondary | ICD-10-CM | POA: Diagnosis not present

## 2015-07-12 DIAGNOSIS — Z8673 Personal history of transient ischemic attack (TIA), and cerebral infarction without residual deficits: Secondary | ICD-10-CM | POA: Insufficient documentation

## 2015-07-12 MED ORDER — DICLOFENAC SODIUM 1 % TD GEL: 4.0000 g | Freq: Four times a day (QID) | TRANSDERMAL | Status: DC

## 2015-07-12 MED ORDER — OXYCODONE-ACETAMINOPHEN 5-325 MG PO TABS: 1.0000 | ORAL_TABLET | Freq: Four times a day (QID) | ORAL | Status: AC | PRN

## 2015-07-12 MED ORDER — NAPROXEN 500 MG PO TABS
500.0000 mg | ORAL_TABLET | Freq: Two times a day (BID) | ORAL | Status: DC
Start: 2015-07-12 — End: 2015-07-12

## 2015-07-12 MED ORDER — METHOCARBAMOL 500 MG PO TABS
1000.0000 mg | ORAL_TABLET | Freq: Once | ORAL | Status: AC
  Administered 2015-07-12: 1000 mg via ORAL
  Filled 2015-07-12: qty 2

## 2015-07-12 MED ORDER — IBUPROFEN 800 MG PO TABS: 800.0000 mg | ORAL_TABLET | Freq: Three times a day (TID) | ORAL | Status: DC

## 2015-07-12 MED ORDER — KETOROLAC TROMETHAMINE 60 MG/2ML IM SOLN
60.0000 mg | Freq: Once | INTRAMUSCULAR | Status: AC
  Administered 2015-07-12: 60 mg via INTRAMUSCULAR
  Filled 2015-07-12: qty 2

## 2015-07-12 MED ORDER — OXYCODONE-ACETAMINOPHEN 5-325 MG PO TABS
1.0000 | ORAL_TABLET | Freq: Once | ORAL | Status: AC
  Administered 2015-07-12: 1 via ORAL
  Filled 2015-07-12: qty 1

## 2015-07-12 MED ORDER — METHOCARBAMOL 500 MG PO TABS: 500.0000 mg | ORAL_TABLET | Freq: Two times a day (BID) | ORAL | Status: DC

## 2015-07-12 NOTE — ED Notes (Signed)
CMS intact before and after. Pt tolerated well. Knee Sleeve applied to both knees. Pt had no questions.

## 2015-07-12 NOTE — ED Provider Notes (Signed)
CSN: [id number]     Arrival date & time 07/12/15  1009 History   First MD Initiated Contact with Patient 07/12/15 1023     Chief Complaint  Patient presents with  . Leg Pain    Bilateral leg pain     (Consider location/radiation/quality/duration/timing/severity/associated sxs/prior Treatment) HPI   Mary Armstrong is a 45 y.o. female, with a history of TIA and anemia, presenting to the ED with bilateral, moderate to severe, constant knee pain that came on suddenly around 3 AM today. Patient states the pain starts in the knees and radiates down both legs into the feet. Patient was sleeping and the pain woke her from sleep. Patient states the greatest amount of pain is to the medial knees. Denies previous pain, injuries, or surgeries to her knees or legs. Patient has taken 800 mg of ibuprofen and 1 Vicodin without relief. Patient endorses difficulty walking due to the pain. Does not endorse any new workout regimens, medications, or changes in routine. Patient states yesterday she was off work and relaxed while watching movies. Patient does endorse being on her feet for at least 11 hours a day, every day as part of her job. No extended travel, history of blood clots, recent trauma or surgery, or any other risk factors for DVT. Denies falls or trauma, fever/chills, neuro deficits, or any other complaints.      Past Medical History  Diagnosis Date  . Genital herpes   . 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 Anemia    Past Surgical History  Procedure Laterality Date  . Myomectomy  2005  . Myomectomy N/A 07/14/2013    Procedure: MYOMECTOMY;  Surgeon: Osborne Oman, MD;  Location: [facility];  Service: Gynecology;  Laterality: N/A;  . Supracervical abdominal hysterectomy N/A 07/14/2013    Procedure: HYSTERECTOMY SUPRACERVICAL ABDOMINAL;  Surgeon: Osborne Oman, MD;  Location: [facility];  Service: Gynecology;  Laterality: N/A;  . Salpingoophorectomy Left 07/14/2013   Procedure: SALPINGO OOPHORECTOMY;  Surgeon: Osborne Oman, MD;  Location: [facility];  Service: Gynecology;  Laterality: Left;  . Lysis of adhesion N/A 07/14/2013    Procedure: EXTENSIVE LYSIS OF ADHESIONS, ENTERO LYSIS OF ADHESIONS;  Surgeon: Osborne Oman, MD;  Location: [facility];  Service: Gynecology;  Laterality: N/A;  . Abdominal hysterectomy     Family History  Problem Relation Age of Onset  . Hypertension Mother     Living, 80  . Thyroid disease Mother   . Healthy Brother     Living, 61  . Breast cancer Maternal Grandmother     Died, 68  . Heart disease Maternal Grandmother    Social History  Substance Use Topics  . Smoking status: Never Smoker   . Smokeless tobacco: Never Used  . Alcohol Use: Yes     Comment: social use   OB History    Gravida Para Term Preterm AB TAB SAB Ectopic Multiple Living   5 0   5 3 1 1   0     Review of Systems  Constitutional: Negative for fever and chills.  Gastrointestinal: Negative for nausea and vomiting.  Musculoskeletal: Positive for joint swelling and arthralgias (bilateral knees). Negative for back pain.  Neurological: Negative for weakness and numbness.  All other systems reviewed and are negative.     Allergies  Review of patient's allergies indicates no known allergies.  Home Medications   Prior to Admission medications   Medication Sig  Start Date End Date Taking? Authorizing Provider  norethindrone-ethinyl estradiol (MICROGESTIN,JUNEL,LOESTRIN) 1-20 MG-MCG tablet Take 1 tablet by mouth daily. 08/12/14  Yes Osborne Oman, MD  valACYclovir (VALTREX) 500 MG tablet TAKE ONE TABLET BY MOUTH TWICE DAILY 07/26/14  Yes Osborne Oman, MD  diclofenac sodium (VOLTAREN) 1 % GEL Apply 4 g topically 4 (four) times daily. 07/12/15   Farouk Vivero C Jhanae Jaskowiak, PA-C  ibuprofen (ADVIL,MOTRIN) 800 MG tablet Take 1 tablet (800 mg total) by mouth 3 (three) times daily. 07/12/15   Luisantonio Adinolfi C Izzah Pasqua, PA-C  methocarbamol (ROBAXIN) 500 MG tablet Take 1 tablet (500 mg  total) by mouth 2 (two) times daily. 07/12/15   Nguyet Mercer C Clarance Bollard, PA-C  oxyCODONE-acetaminophen (PERCOCET/ROXICET) 5-325 MG tablet Take 1-2 tablets by mouth every 6 (six) hours as needed for severe pain. 07/12/15   Mizael Sagar C Fayez Sturgell, PA-C   BP 112/67 mmHg  Pulse 74  Temp(Src) 98.6 F (37 C) (Oral)  Resp 18  Ht 5\' 2"  (1.575 m)  Wt 63.504 kg  BMI 25.60 kg/m2  SpO2 99%  LMP 06/03/2013 Physical Exam  Constitutional: She appears well-developed and well-nourished. No distress.  HENT:  Head: Normocephalic and atraumatic.  Eyes: Conjunctivae are normal.  Neck: Neck supple.  Cardiovascular: Normal rate, regular rhythm, normal heart sounds and intact distal pulses.   Pulmonary/Chest: Effort normal and breath sounds normal. No respiratory distress.  Abdominal: Soft. There is no tenderness. There is no guarding.  Musculoskeletal: She exhibits no edema or tenderness.  Tenderness to the anterior and medial knees bilaterally. No discernible swelling, effusion, erythema, exquisite tenderness, or increased warmth. Active and passive range of motion in the knees, ankles, and feet are intact, though painful. No deformity, laxity, or crepitus noted.  No pain or tenderness in the patient's hips or spine. Full range of motion without pain in these areas.  Lymphadenopathy:    She has no cervical adenopathy.  Neurological: She is alert. She has normal reflexes.  No sensory deficits; to including light touch, deep touch, and pain in each dermatomal distribution of the foot and leg. Strength 5/5 in all extremities. Coordination intact.   Skin: Skin is warm and dry. No rash noted. She is not diaphoretic. No erythema.  Psychiatric: She has a normal mood and affect. Her behavior is normal.  Nursing note and vitals reviewed.   ED Course  Procedures (including critical care time)  Imaging Review Dg Knee Complete 4 Views Left  07/12/2015  CLINICAL DATA:  Pain in both knees beginning this morning. Unable to walk. EXAM: LEFT  KNEE - COMPLETE 4+ VIEW COMPARISON:  None. FINDINGS: No evidence of fracture, dislocation, or joint effusion. No evidence of arthropathy or other focal bone abnormality. Soft tissues are unremarkable. IMPRESSION: Negative. Electronically Signed   By: Rolm Baptise M.D.   On: 07/12/2015 10:59   Dg Knee Complete 4 Views Right  07/12/2015  CLINICAL DATA:  Pain in both knees today.  Unable to walk. EXAM: RIGHT KNEE - COMPLETE 4+ VIEW COMPARISON:  None. FINDINGS: No evidence of fracture, dislocation, or joint effusion. No evidence of arthropathy or other focal bone abnormality. Soft tissues are unremarkable. IMPRESSION: Negative. Electronically Signed   By: Rolm Baptise M.D.   On: 07/12/2015 10:59   I have personally reviewed and evaluated these images as part of my medical decision-making.   EKG Interpretation None       Medications  ketorolac (TORADOL) injection 60 mg (60 mg Intramuscular Given 07/12/15 1041)  oxyCODONE-acetaminophen (PERCOCET/ROXICET) 5-325 MG  per tablet 1 tablet (1 tablet Oral Given 07/12/15 1040)  methocarbamol (ROBAXIN) tablet 1,000 mg (1,000 mg Oral Given 07/12/15 1055)    MDM   Final diagnoses:  Knee pain, bilateral    Mary Armstrong presents with bilateral knee and lower leg pain that began suddenly at 3 AM this morning.  Findings and plan of care discussed with Veryl Speak, MD. Dr. Stark Jock personally evaluated and examined this patient.  Patient is nontoxic appearing, afebrile, not tachycardic, not tachypneic, and not hypotensive. Patient has no signs of sepsis or other serious or life-threatening condition. Patient is tearful on exam, but reasonable. Physical exam findings, history, and patient presentation are not suggestive of septic joint. The bilateral nature of the pain as well as physical exam findings are not suggestive of DVT. Upon reevaluation, patient voices significant improvement in her pain. Endorses some aching/burning pain in both knees, still worse with range  of motion. Although the patient states she has never had a similar complaint or previous pain in her knees, there is at least one instance where the patient came in complaining of very similar pain, although this was unilateral on the left. Patient ambulated slowly but steadily and without assistance. Patient to follow-up with orthopedics as soon as possible. The patient was given instructions for home care as well as return precautions. Patient voices understanding of these instructions, accepts the plan, and is comfortable with discharge.   Filed Vitals:   07/12/15 1019 07/12/15 1214  BP: 112/67 121/74  Pulse: 74 92  Temp: 98.6 F (37 C)   TempSrc: Oral   Resp: 18 18  Height: 5\' 2"  (1.575 m)   Weight: 63.504 kg   SpO2: 99% 100%       Lorayne Bender, PA-C 07/12/15 [place], MD 07/12/15 1440

## 2015-07-12 NOTE — ED Notes (Signed)
Patient states she has bilateral lower leg pain from the knees down.  Denies injury, was sleeping when pain began.

## 2015-07-12 NOTE — ED Notes (Signed)
Pt ambulated in hallway. Pt walked really slow and still complained of pain, but was in NAD. Denies dizziness, SOB, or nausea.

## 2015-07-12 NOTE — Discharge Instructions (Signed)
You have been seen today for knee pain. Your imaging showed no abnormalities.    1. Knee pain: Take it easy, but do not lay around too much as this may make any stiffness worse. Take 500 mg of naproxen every 12 hours or 800 mg of ibuprofen every 8 hours for the next 3 days. Take these medications with food to avoid upset stomach. Robaxin is a muscle relaxer and may help loosen stiff muscles. Percocet for severe pain. Do not take the Robaxin or Percocet while driving or performing other dangerous activities. Keep the extremity elevated and apply ice intermittently to reduce inflammation. You have also been prescribed diclofenac gel, a topical anti-inflammatory medication. You may use this instead of the naproxen or ibuprofen. Apply directly to the painful areas. 2. Orthopedics: Follow-up with orthopedics as soon as possible. Call the number provided to set up an appointment. 3. Other follow-up: Follow up with PCP as needed. Return to ED should symptoms worsen.

## 2015-10-10 ENCOUNTER — Ambulatory Visit (INDEPENDENT_AMBULATORY_CARE_PROVIDER_SITE_OTHER): Payer: Self-pay | Admitting: Sports Medicine

## 2016-01-18 IMAGING — US US TRANSVAGINAL NON-OB
1 series · 13 of 25 positions shown · non-contrast
Comparison: 05/03/2011

CLINICAL DATA: Evaluate fibroids, status post myomectomy



[Series 1: us pelvis complete · 13 of 56 slices shown]
[im 1/56]
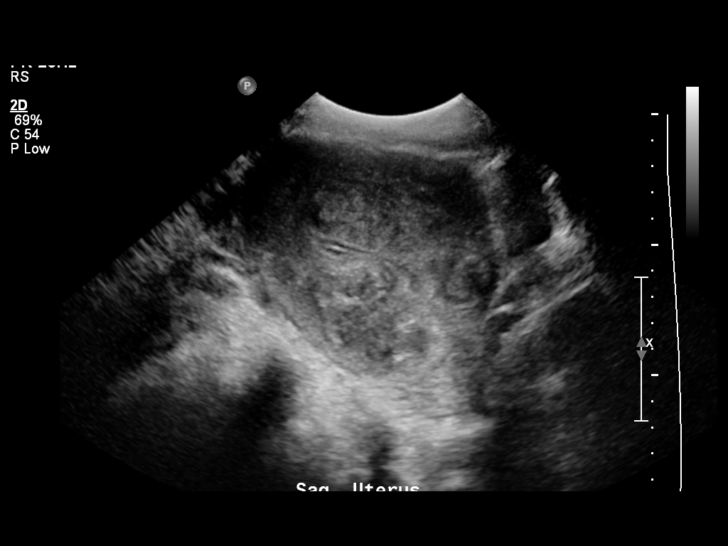
[im 5/56]
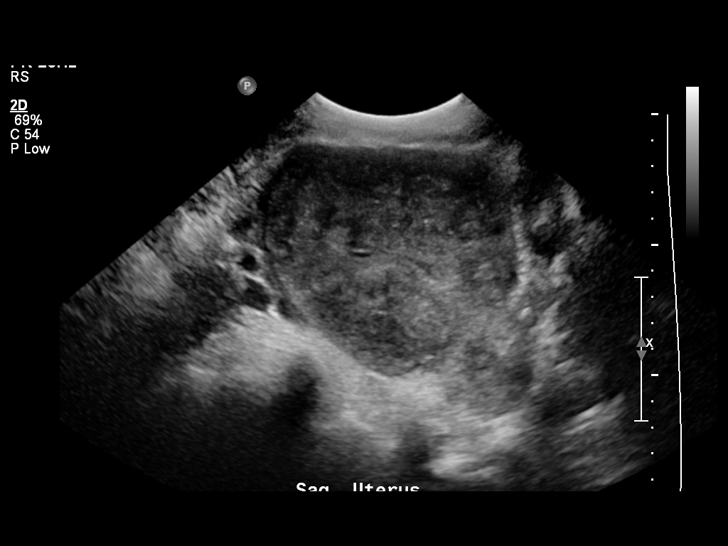
[im 10/56]
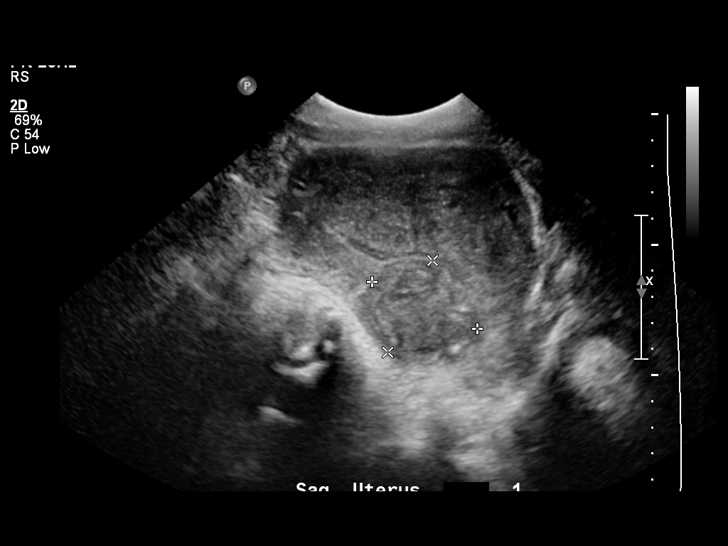
[im 14/56]
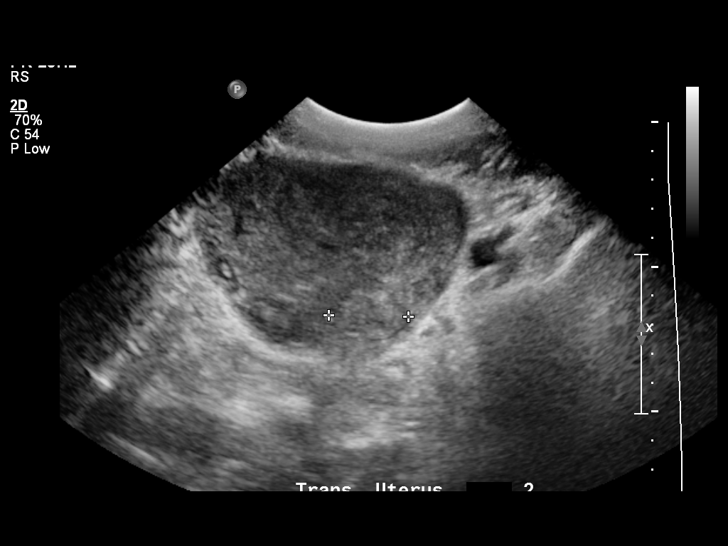
[im 19/56]
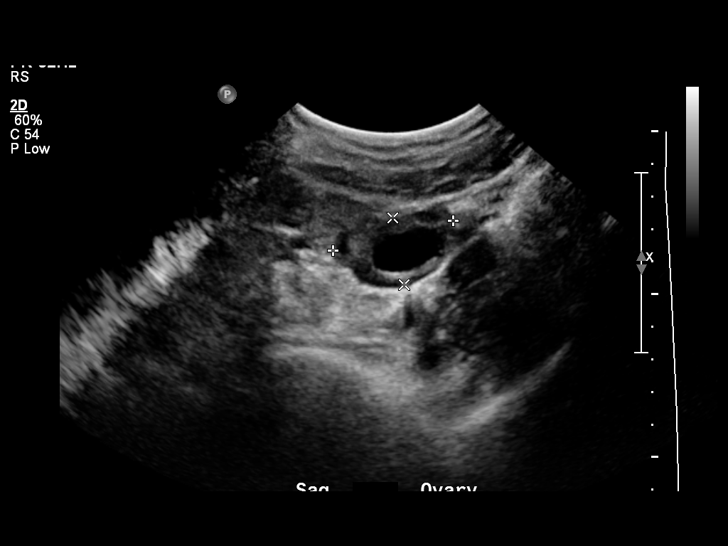
[im 23/56]
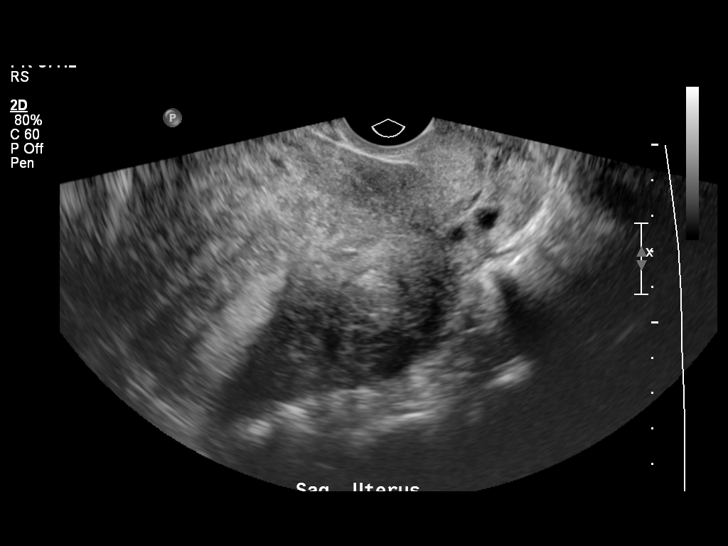
[im 28/56]
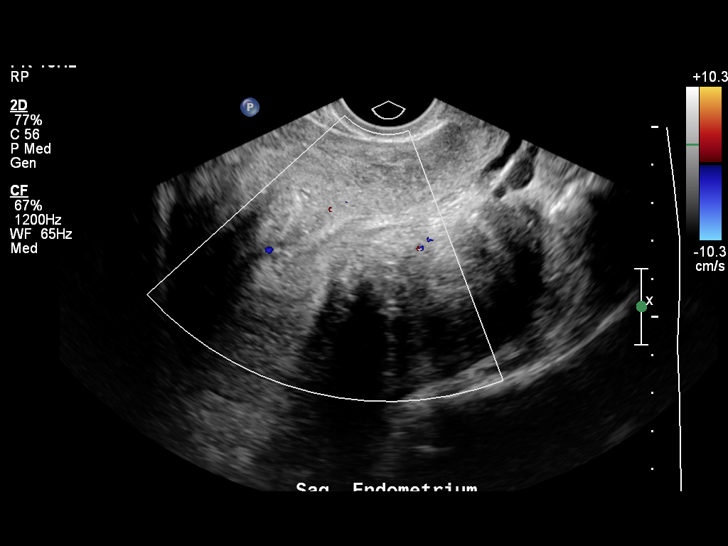
[im 33/56]
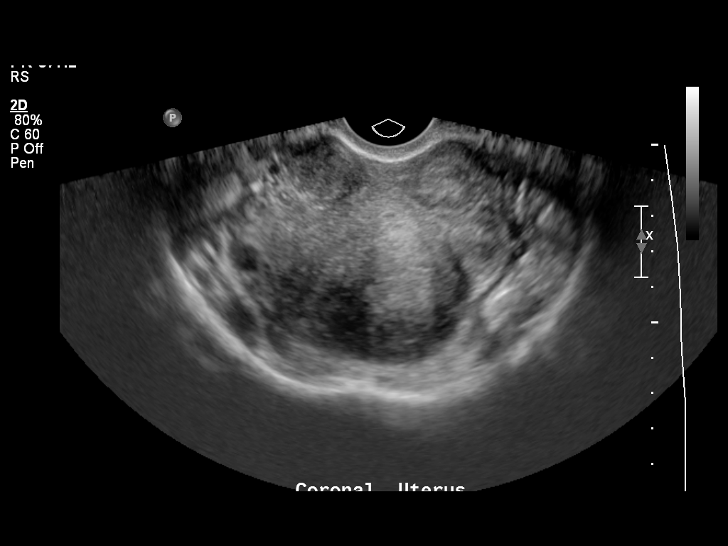
[im 37/56]
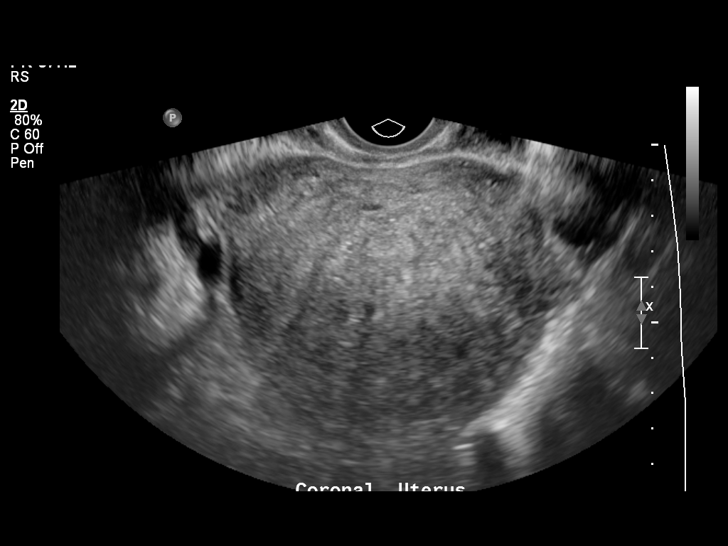
[im 42/56]
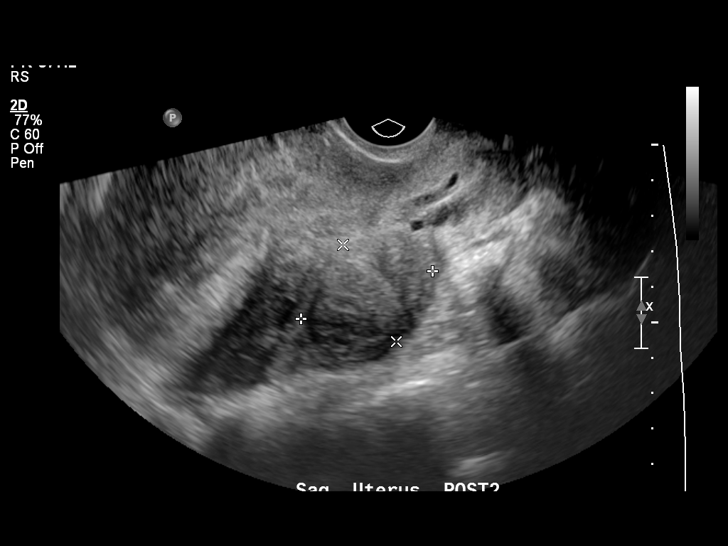
[im 46/56]
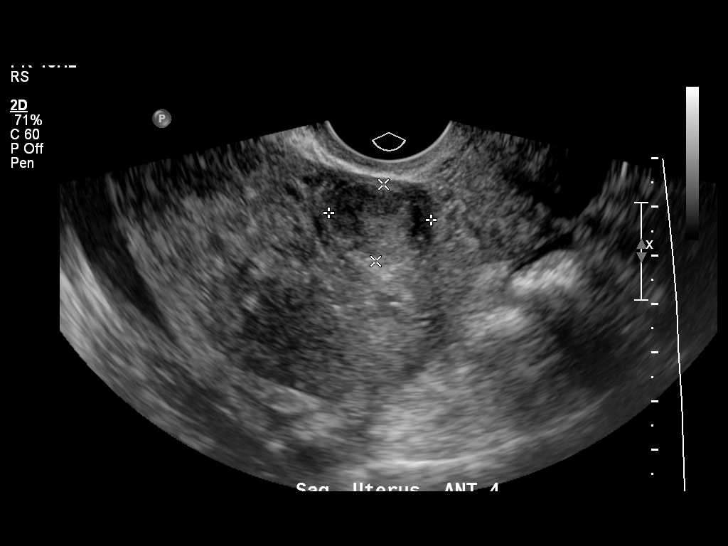
[im 51/56]
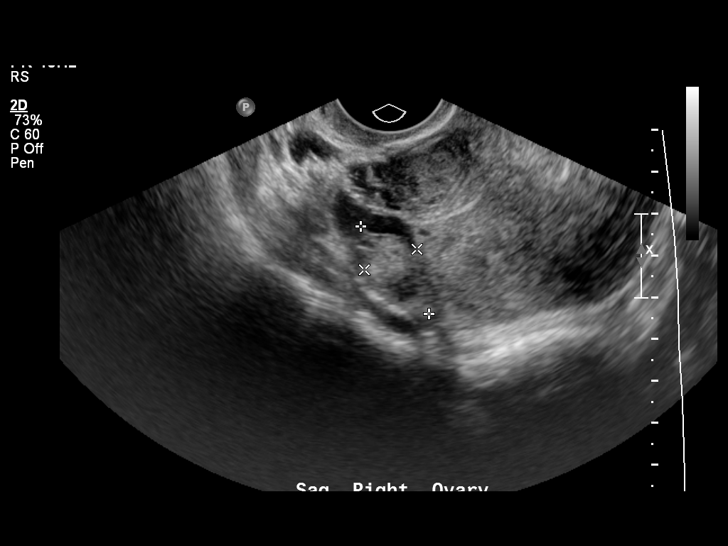
[im 56/56]
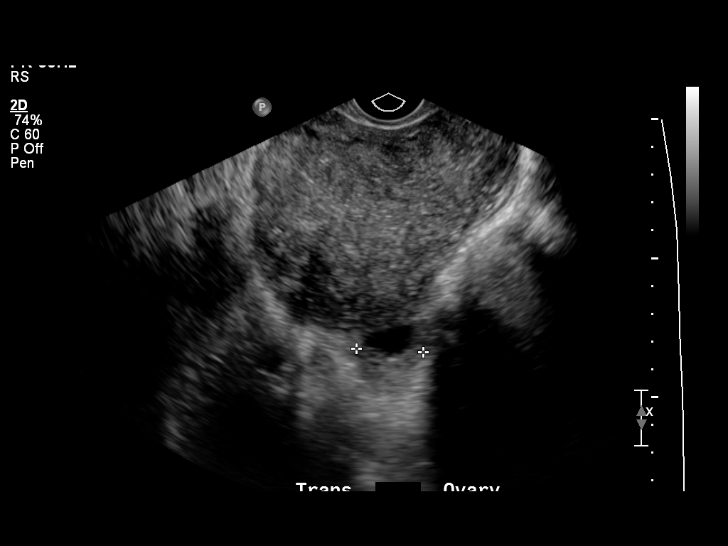

[13 of 25 positions shown; findings below may reference images not displayed]

FINDINGS: Uterus

Measurements: 12.9 x 8.3 x 8.9 cm. Multiple uterine fibroids,
including:

--5.4 x 5.2 x 5.6 cm transmural left fundal fibroid, previously
x 5.2 x 4.3 cm

--3.9 x 3.9 x 3.1 cm intramural/subserosal left posterior uterine
body fibroid

--2.1 x 2.3 x 2.4 cm intramural/submucosal right posterior fundal
fibroid

--2.2 x 2.1 x 1.6 cm subserosal right uterine body fibroid

Endometrium

Thickness: 13 mm.  No focal abnormality visualized.

Right ovary

Measurements: 2.7 x 1.4 x 2.1 cm. Normal appearance/no adnexal mass.

Left ovary

Measurements: 3.8 x 2.1 x 3.1 cm. Normal appearance/no adnexal mass.

Other findings

No free fluid.
IMPRESSION: Multiple uterine fibroids, as above.

Dominant 5.6 cm transmural left fundal fibroid is minimally
increased from 0661.

## 2016-03-09 ENCOUNTER — Other Ambulatory Visit: Payer: Self-pay | Admitting: Sports Medicine

## 2016-04-02 ENCOUNTER — Ambulatory Visit (INDEPENDENT_AMBULATORY_CARE_PROVIDER_SITE_OTHER): Payer: BLUE CROSS/BLUE SHIELD | Admitting: Orthopedic Surgery

## 2016-05-08 ENCOUNTER — Ambulatory Visit (INDEPENDENT_AMBULATORY_CARE_PROVIDER_SITE_OTHER): Payer: Self-pay

## 2016-05-08 ENCOUNTER — Encounter (INDEPENDENT_AMBULATORY_CARE_PROVIDER_SITE_OTHER): Payer: Self-pay | Admitting: Orthopaedic Surgery

## 2016-05-08 ENCOUNTER — Ambulatory Visit (INDEPENDENT_AMBULATORY_CARE_PROVIDER_SITE_OTHER): Payer: BLUE CROSS/BLUE SHIELD | Admitting: Orthopaedic Surgery

## 2016-05-08 DIAGNOSIS — G8929 Other chronic pain: Secondary | ICD-10-CM

## 2016-05-08 DIAGNOSIS — M545 Low back pain: Secondary | ICD-10-CM | POA: Diagnosis not present

## 2016-05-08 MED ORDER — GABAPENTIN 300 MG PO CAPS: 300.0000 mg | ORAL_CAPSULE | Freq: Two times a day (BID) | ORAL | 2 refills | Status: DC | PRN

## 2016-05-08 NOTE — Progress Notes (Signed)
Office Visit Note   Patient: Mary Armstrong           Date of Birth: March 26, 1970           MRN: [id number] Visit Date: 05/08/2016              Requested by: No referring provider defined for this encounter. PCP: No PCP Per Patient   Assessment & Plan: Visit Diagnoses:  1. Chronic bilateral low back pain, with sciatica presence unspecified     Plan: MRI of the lumbar spine ordered to evaluate for structural mallet he given persistent lumbar radiculopathy. I did send in a referral for physical therapy [place] [facility]. I refilled gabapentin. Follow up with me after the MRI.  Follow-Up Instructions: Return in about 2 weeks (around 05/22/2016).   Orders:  Orders Placed This Encounter  Procedures  . MR Lumbar Spine w/o contrast   No orders of the defined types were placed in this encounter.     Procedures: No procedures performed   Clinical Data: No additional findings.   Subjective: Chief Complaint  Patient presents with  . Left Leg - Pain    Patient is a 47 year old female who has seen Dr. Paulla Fore in the past which comes in with her current left lower extremity numbness and pain mainly around the posterior lateral aspect of the knee and calf. She has tried home exercises in the past. She takes ibuprofen and gabapentin.. She denies any focal deficits.    Review of Systems  Constitutional: Negative.   HENT: Negative.   Eyes: Negative.   Respiratory: Negative.   Cardiovascular: Negative.   Endocrine: Negative.   Musculoskeletal: Negative.   Neurological: Negative.   Hematological: Negative.   Psychiatric/Behavioral: Negative.   All other systems reviewed and are negative.    Objective: Vital Signs: LMP 06/03/2013 Comment: sporadic  Physical Exam  Constitutional: She is oriented to person, place, and time. She appears well-developed and well-nourished.  Pulmonary/Chest: Effort normal.  Neurological: She is alert and oriented to  person, place, and time.  Skin: Skin is warm. Capillary refill takes less than 2 seconds.  Psychiatric: She has a normal mood and affect. Her behavior is normal. Judgment and thought content normal.  Nursing note and vitals reviewed.   Ortho Exam Left lower extremity back exam shows no real reproducible pain with palpation of the left leg. Negative sciatic tension signs Specialty Comments:  No specialty comments available.  Imaging: No results found.   PMFS History: Patient Active Problem List   Diagnosis Date Noted  . Ovarian cyst 03/11/2014  . Trichomonas vaginalis (TV) infection 07/18/2013  . Constipation due to slow transit 07/18/2013  . S/P hysterectomy 07/14/2013  . Breast lump on left side at 2 o'clock position 11/25/2012  . Breast lump on right side at 2 o'clock position 11/25/2012  . Disturbance of skin sensation 09/10/2012  . Anemia 08/30/2012  . Genital herpes 08/28/2012  . Right arm pain 08/08/2012  . Biceps tendinitis on right 08/08/2012   Past Medical History:  Diagnosis Date  . Anemia   . Genital herpes   . TIA (transient ischemic attack)    "light stroke" age 89 or 48yrs    Family History  Problem Relation Age of Onset  . Hypertension Mother     Living, 20  . Thyroid disease Mother   . Healthy Brother     Living, 46  . Breast cancer Maternal Grandmother     Died,  89  . Heart disease Maternal Grandmother     Past Surgical History:  Procedure Laterality Date  . ABDOMINAL HYSTERECTOMY    . LYSIS OF ADHESION N/A 07/14/2013   Procedure: EXTENSIVE LYSIS OF ADHESIONS, ENTERO LYSIS OF ADHESIONS;  Surgeon: Osborne Oman, MD;  Location: [facility];  Service: Gynecology;  Laterality: N/A;  . MYOMECTOMY  2005  . MYOMECTOMY N/A 07/14/2013   Procedure: MYOMECTOMY;  Surgeon: Osborne Oman, MD;  Location: [facility];  Service: Gynecology;  Laterality: N/A;  . SALPINGOOPHORECTOMY Left 07/14/2013   Procedure: SALPINGO OOPHORECTOMY;  Surgeon: Osborne Oman, MD;   Location: [facility];  Service: Gynecology;  Laterality: Left;  . SUPRACERVICAL ABDOMINAL HYSTERECTOMY N/A 07/14/2013   Procedure: HYSTERECTOMY SUPRACERVICAL ABDOMINAL;  Surgeon: Osborne Oman, MD;  Location: [facility];  Service: Gynecology;  Laterality: N/A;   Social History   Occupational History  . Not on file.   Social History Main Topics  . Smoking status: Never Smoker  . Smokeless tobacco: Never Used  . Alcohol use Yes     Comment: social use  . Drug use: No  . Sexual activity: No

## 2016-05-22 ENCOUNTER — Ambulatory Visit (INDEPENDENT_AMBULATORY_CARE_PROVIDER_SITE_OTHER): Payer: BLUE CROSS/BLUE SHIELD | Admitting: Orthopaedic Surgery

## 2016-05-22 ENCOUNTER — Encounter (INDEPENDENT_AMBULATORY_CARE_PROVIDER_SITE_OTHER): Payer: Self-pay

## 2016-05-25 ENCOUNTER — Ambulatory Visit (INDEPENDENT_AMBULATORY_CARE_PROVIDER_SITE_OTHER): Payer: BLUE CROSS/BLUE SHIELD | Admitting: Orthopaedic Surgery

## 2016-07-20 ENCOUNTER — Other Ambulatory Visit (INDEPENDENT_AMBULATORY_CARE_PROVIDER_SITE_OTHER): Payer: Self-pay | Admitting: Family

## 2016-07-20 MED ORDER — GABAPENTIN 300 MG PO CAPS: 300.0000 mg | ORAL_CAPSULE | Freq: Two times a day (BID) | ORAL | 3 refills | Status: AC | PRN

## 2016-09-17 IMAGING — US US ART/VEN ABD/PELV/SCROTUM DOPPLER LTD
1 series · 13 of 25 positions shown · non-contrast
Comparison: CT abdomen and pelvis 07/18/2013. Pelvic ultrasound
05/22/2013.

CLINICAL DATA: Right lower quadrant pain for 2 days. Prior
hysterectomy in [DATE] and left salpingo oophorectomy. History of
fibroids.

EXAM:
TRANSABDOMINAL AND TRANSVAGINAL ULTRASOUND OF PELVIS
DOPPLER ULTRASOUND OF OVARIES
TECHNIQUE: Both transabdominal and transvaginal ultrasound examinations of the
pelvis were performed. Transabdominal technique was performed for
global imaging of the pelvis including uterus, ovaries, adnexal
regions, and pelvic cul-de-sac.
It was necessary to proceed with endovaginal exam following the
transabdominal exam to visualize the right ovary. Color and duplex
Doppler ultrasound was utilized to evaluate blood flow to the
ovaries.

[Series 1: us art/ven abd/pelv/scrotum doppler ltd · 0.24mm/px · 13 of 62 slices shown]
[im 1/62]
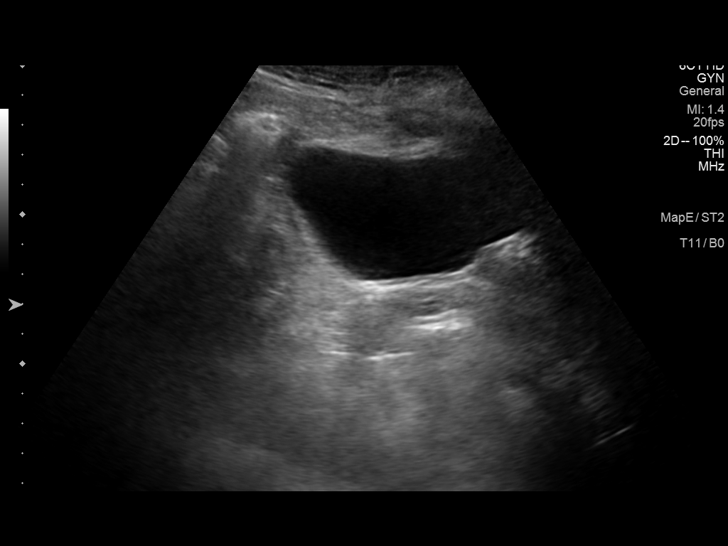
[im 6/62]
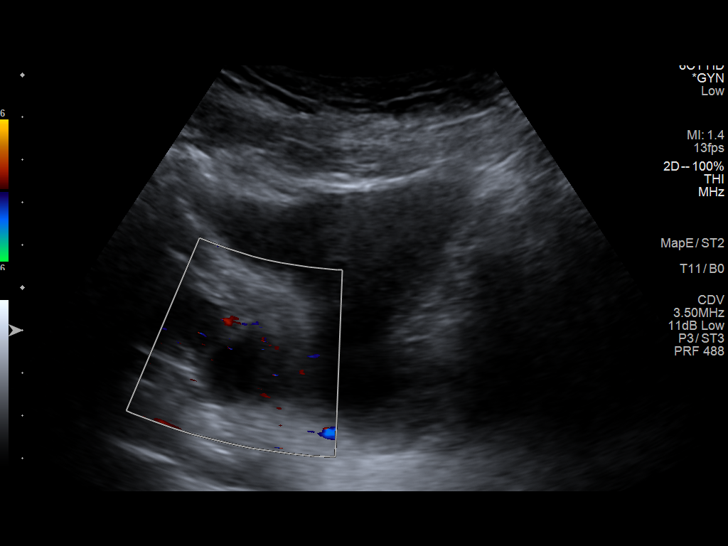
[im 11/62]
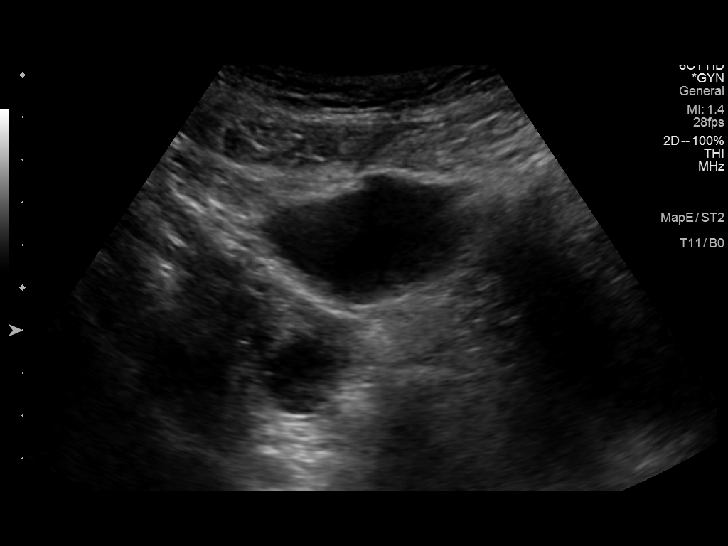
[im 16/62]
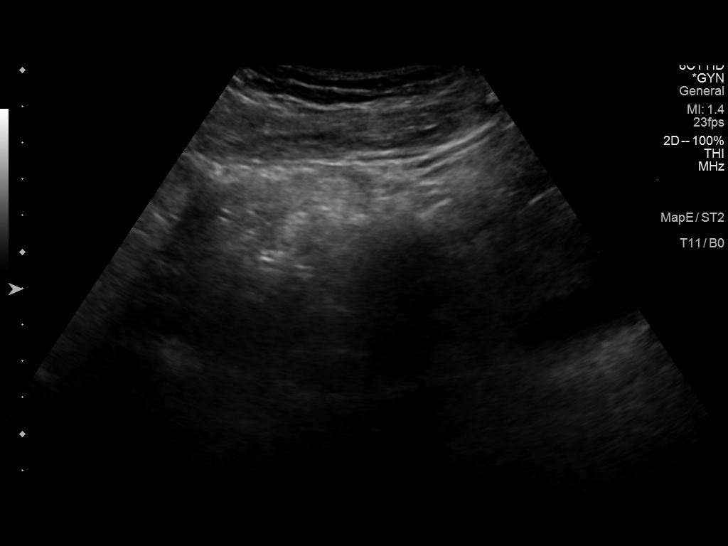
[im 21/62]
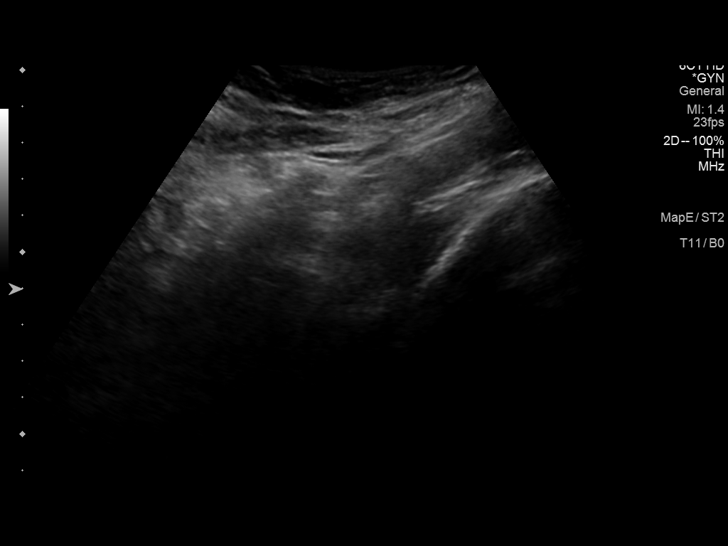
[im 26/62]
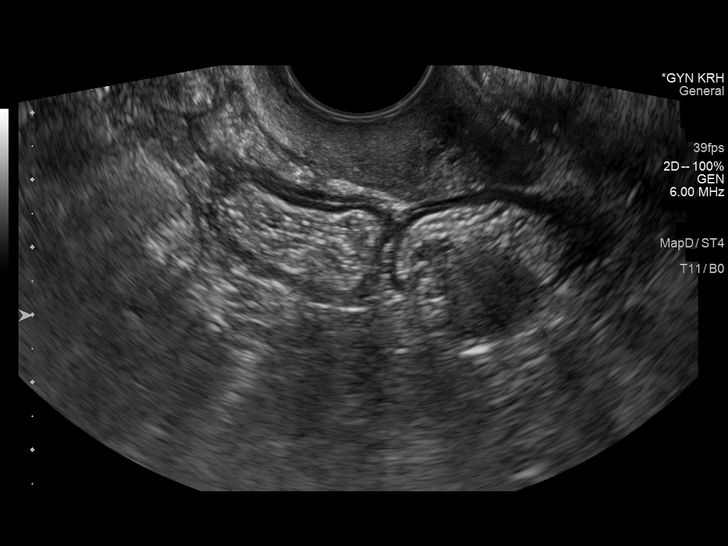
[im 31/62]
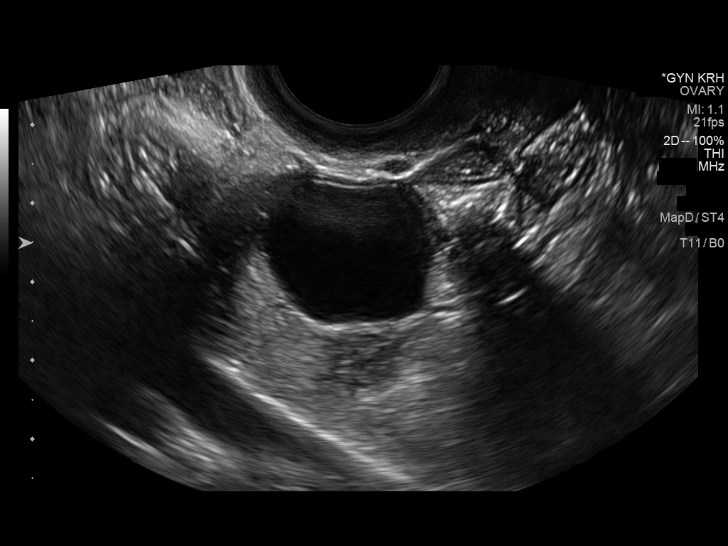
[im 36/62]
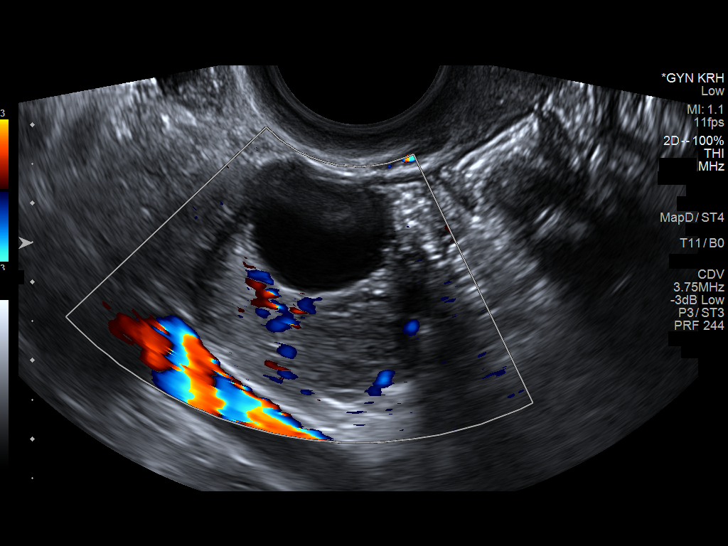
[im 41/62]
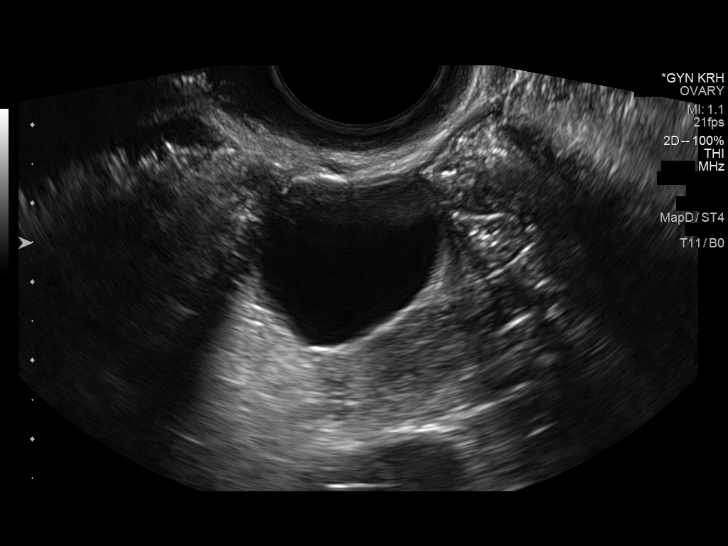
[im 46/62]
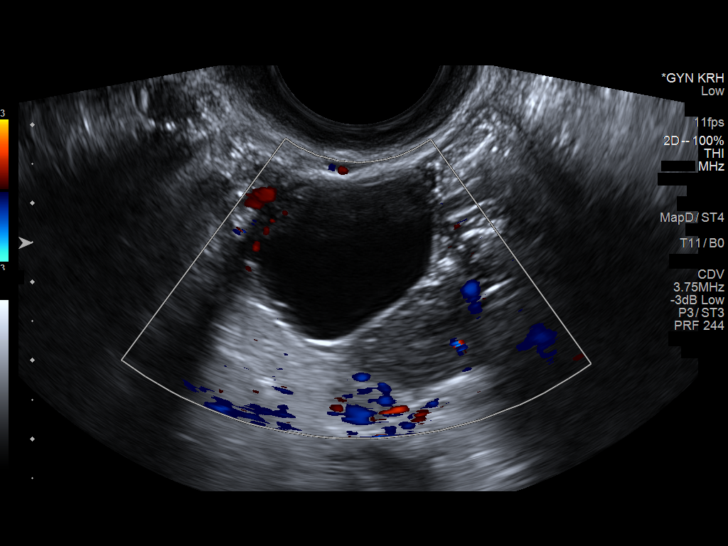
[im 51/62]
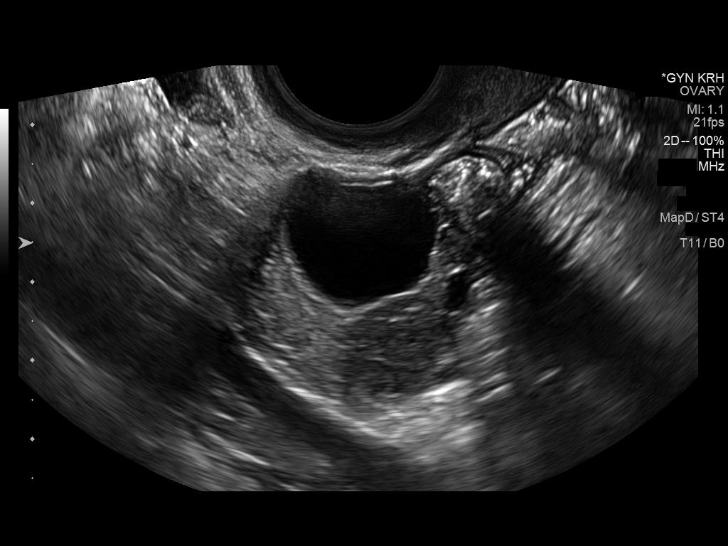
[im 56/62]
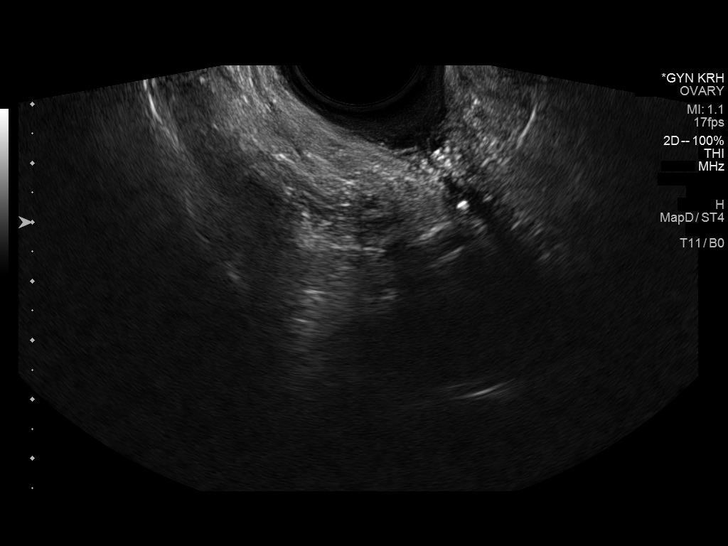
[im 62/62]
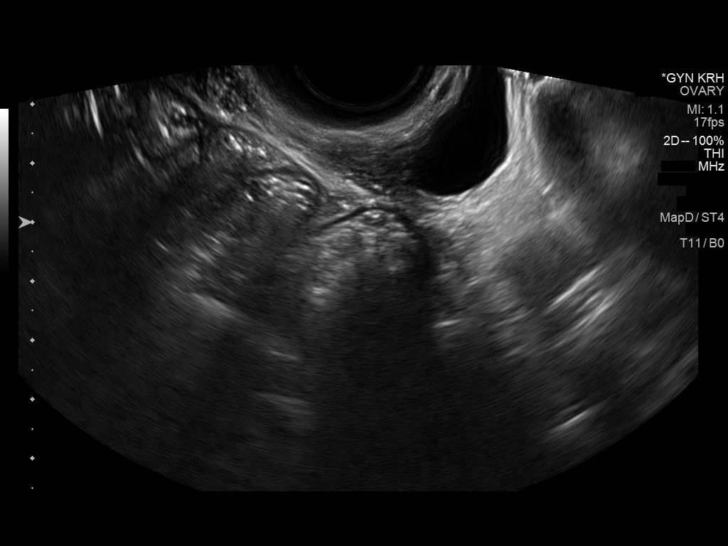

[13 of 25 positions shown; findings below may reference images not displayed]

FINDINGS: Uterus

Surgically absent.

Left ovary

Surgically absent.

Right ovary

Measurements: 3.4 x 2.8 x 3.1 cm. Cyst with single thin internal
septation and otherwise simple appearance measures 2.3 x 2.4 x
cm. There is an oval 1.3 x 1.2 x 1.1 cm focus in the every which is
slightly hypoechoic compared to adjacent ovarian parenchyma with a
small amount of internal vascularity on color Doppler.

Pulsed Doppler evaluation of the right ovary demonstrates normal
low-resistance arterial and venous waveforms.

Other findings

No free fluid.
IMPRESSION: 1. Status post hysterectomy and left oophorectomy.
2. No evidence of right ovarian torsion during this examination.
Right ovarian cyst with single internal septation and questionable
1.3 cm solid nodule versus artifact. Repeat pelvic ultrasound is
suggested in 6 weeks to assess for resolution.

## 2016-09-20 ENCOUNTER — Telehealth (INDEPENDENT_AMBULATORY_CARE_PROVIDER_SITE_OTHER): Payer: Self-pay | Admitting: Orthopaedic Surgery

## 2016-09-20 NOTE — Telephone Encounter (Signed)
Called pt no answer LMOM with details

## 2016-09-20 NOTE — Telephone Encounter (Signed)
yes

## 2016-09-20 NOTE — Telephone Encounter (Signed)
Is this okay?

## 2016-09-20 NOTE — Telephone Encounter (Signed)
Patient called asking for a verbal okay for her to have a physical for her job, if you could give her a call back this morning if at all possible. Thank you. CB # [phone]

## 2016-09-26 ENCOUNTER — Telehealth (INDEPENDENT_AMBULATORY_CARE_PROVIDER_SITE_OTHER): Payer: Self-pay | Admitting: Orthopaedic Surgery

## 2016-09-26 NOTE — Telephone Encounter (Signed)
Called patient and hung up the first time.  Called again and no answer LMOM.  I wanted to see why she did not have MRI done back in May 2018. Has appt scheduled for Thursday for  "MRI REVIEW."  Will try to call again later.

## 2016-09-26 NOTE — Telephone Encounter (Signed)
Patient was returning your call.  Please call back.

## 2016-09-26 NOTE — Telephone Encounter (Signed)
Patient called stating that Dr. Erlinda Hong wanted her to get an MRI before her appointment tomorrow.  She has not done her MRI and is needing another referral to get that done.  She is keeping her appointment for tomorrow with Dr. Erlinda Hong.  [phone].  Thank you.

## 2016-09-26 NOTE — Telephone Encounter (Signed)
Called patient back no answer 

## 2016-09-27 ENCOUNTER — Ambulatory Visit (INDEPENDENT_AMBULATORY_CARE_PROVIDER_SITE_OTHER): Payer: BLUE CROSS/BLUE SHIELD | Admitting: Orthopaedic Surgery

## 2016-09-27 ENCOUNTER — Encounter (INDEPENDENT_AMBULATORY_CARE_PROVIDER_SITE_OTHER): Payer: Self-pay | Admitting: Orthopaedic Surgery

## 2016-09-27 DIAGNOSIS — G8929 Other chronic pain: Secondary | ICD-10-CM

## 2016-09-27 DIAGNOSIS — M545 Low back pain: Secondary | ICD-10-CM

## 2016-09-27 NOTE — Progress Notes (Signed)
Office Visit Note   Patient: Mary Armstrong           Date of Birth: June 13, 1970           MRN: [id number] Visit Date: 09/27/2016              Requested by: No referring provider defined for this encounter. PCP: Patient, No Pcp Per   Assessment & Plan: Visit Diagnoses:  1. Chronic bilateral low back pain, with sciatica presence unspecified     Plan: Patient has continued lumbar radiculopathy. We will submit request for another MRI of the lumbar spine to rule out structural abnormalities. Follow-up after the MRI.  Follow-Up Instructions: Return in about 2 weeks (around 10/11/2016).   Orders:  Orders Placed This Encounter  Procedures  . MR Lumbar Spine w/o contrast   No orders of the defined types were placed in this encounter.     Procedures: No procedures performed   Clinical Data: No additional findings.   Subjective: Chief Complaint  Patient presents with  . Lower Back - Pain, Follow-up    Patient follows up today for continued low back pain and radiculopathy. She was unable to get a MRI last time we saw her. She states that she is minimally better and would still like to undergo an MRI scan this time.    Review of Systems  Constitutional: Negative.   HENT: Negative.   Eyes: Negative.   Respiratory: Negative.   Cardiovascular: Negative.   Endocrine: Negative.   Musculoskeletal: Negative.   Neurological: Negative.   Hematological: Negative.   Psychiatric/Behavioral: Negative.   All other systems reviewed and are negative.    Objective: Vital Signs: LMP 06/03/2013 Comment: sporadic  Physical Exam  Constitutional: She is oriented to person, place, and time. She appears well-developed and well-nourished.  Pulmonary/Chest: Effort normal.  Neurological: She is alert and oriented to person, place, and time.  Skin: Skin is warm. Capillary refill takes less than 2 seconds.  Psychiatric: She has a normal mood and affect. Her behavior is normal. Judgment  and thought content normal.  Nursing note and vitals reviewed.   Ortho Exam Exam is stable. Specialty Comments:  No specialty comments available.  Imaging: No results found.   PMFS History: Patient Active Problem List   Diagnosis Date Noted  . Ovarian cyst 03/11/2014  . Trichomonas vaginalis (TV) infection 07/18/2013  . Constipation due to slow transit 07/18/2013  . S/P hysterectomy 07/14/2013  . Breast lump on left side at 2 o'clock position 11/25/2012  . Breast lump on right side at 2 o'clock position 11/25/2012  . Disturbance of skin sensation 09/10/2012  . Anemia 08/30/2012  . Genital herpes 08/28/2012  . Right arm pain 08/08/2012  . Biceps tendinitis on right 08/08/2012   Past Medical History:  Diagnosis Date  . Anemia   . Genital herpes   . TIA (transient ischemic attack)    "light stroke" age 54 or 75yrs    Family History  Problem Relation Age of Onset  . Hypertension Mother        Living, 76  . Thyroid disease Mother   . Healthy Brother        Living, 63  . Breast cancer Maternal Grandmother        Died, 68  . Heart disease Maternal Grandmother     Past Surgical History:  Procedure Laterality Date  . ABDOMINAL HYSTERECTOMY    . LYSIS OF ADHESION N/A 07/14/2013   Procedure: EXTENSIVE LYSIS OF  ADHESIONS, ENTERO LYSIS OF ADHESIONS;  Surgeon: Osborne Oman, MD;  Location: [facility];  Service: Gynecology;  Laterality: N/A;  . MYOMECTOMY  2005  . MYOMECTOMY N/A 07/14/2013   Procedure: MYOMECTOMY;  Surgeon: Osborne Oman, MD;  Location: [facility];  Service: Gynecology;  Laterality: N/A;  . SALPINGOOPHORECTOMY Left 07/14/2013   Procedure: SALPINGO OOPHORECTOMY;  Surgeon: Osborne Oman, MD;  Location: [facility];  Service: Gynecology;  Laterality: Left;  . SUPRACERVICAL ABDOMINAL HYSTERECTOMY N/A 07/14/2013   Procedure: HYSTERECTOMY SUPRACERVICAL ABDOMINAL;  Surgeon: Osborne Oman, MD;  Location: [facility];  Service: Gynecology;  Laterality: N/A;   Social History    Occupational History  . Not on file.   Social History Main Topics  . Smoking status: Never Smoker  . Smokeless tobacco: Never Used  . Alcohol use Yes     Comment: social use  . Drug use: No  . Sexual activity: No

## 2016-10-13 ENCOUNTER — Ambulatory Visit
Admission: RE | Admit: 2016-10-13 | Discharge: 2016-10-13 | Disposition: A | Discharge status: Home / Self Care | Payer: BLUE CROSS/BLUE SHIELD | Source: Ambulatory Visit | Attending: Orthopaedic Surgery | Admitting: Orthopaedic Surgery

## 2016-10-13 DIAGNOSIS — G8929 Other chronic pain: Secondary | ICD-10-CM

## 2016-10-13 DIAGNOSIS — M545 Low back pain: Principal | ICD-10-CM

## 2016-10-16 ENCOUNTER — Telehealth (INDEPENDENT_AMBULATORY_CARE_PROVIDER_SITE_OTHER): Payer: Self-pay | Admitting: Orthopaedic Surgery

## 2016-10-16 ENCOUNTER — Ambulatory Visit (INDEPENDENT_AMBULATORY_CARE_PROVIDER_SITE_OTHER): Payer: BLUE CROSS/BLUE SHIELD | Admitting: Orthopaedic Surgery

## 2016-10-16 ENCOUNTER — Encounter (INDEPENDENT_AMBULATORY_CARE_PROVIDER_SITE_OTHER): Payer: Self-pay | Admitting: Orthopaedic Surgery

## 2016-10-16 DIAGNOSIS — M25562 Pain in left knee: Secondary | ICD-10-CM

## 2016-10-16 DIAGNOSIS — M25561 Pain in right knee: Secondary | ICD-10-CM | POA: Diagnosis not present

## 2016-10-16 DIAGNOSIS — M545 Low back pain: Secondary | ICD-10-CM

## 2016-10-16 DIAGNOSIS — G8929 Other chronic pain: Secondary | ICD-10-CM

## 2016-10-16 NOTE — Telephone Encounter (Signed)
Patient asked to be called Friday to set up MRI, she has an appointment for the MRI review on the 16th, and I told her that she will probably not have her MRI by then but I'll message you and see. CB # [phone]

## 2016-10-16 NOTE — Progress Notes (Signed)
Office Visit Note   Patient: Mary Armstrong           Date of Birth: October 21, 1970           MRN: [id number] Visit Date: 10/16/2016              Requested by: No referring provider defined for this encounter. PCP: Patient, No Pcp Per   Assessment & Plan: Visit Diagnoses:  1. Chronic bilateral low back pain, with sciatica presence unspecified   2. Chronic pain of both knees     Plan: MRI of her lumbar spine is normal and does not explain her symptoms. At this point recommend MRI of bilateral knees to rule out structural abnormalities given significant mechanical symptoms of giving way and pain. Follow-up in a couple weeks.  Follow-Up Instructions: Return in about 2 weeks (around 10/30/2016).   Orders:  Orders Placed This Encounter  Procedures  . MR Knee Right w/o contrast  . MR Knee Left w/o contrast   No orders of the defined types were placed in this encounter.     Procedures: No procedures performed   Clinical Data: No additional findings.   Subjective: Chief Complaint  Patient presents with  . Right Knee - Pain  . Left Knee - Pain    Patient follows up today to review her MRI. She continues to have bilateral leg pain with buckling that localizes more to the knees.    Review of Systems  Constitutional: Negative.   HENT: Negative.   Eyes: Negative.   Respiratory: Negative.   Cardiovascular: Negative.   Endocrine: Negative.   Musculoskeletal: Negative.   Neurological: Negative.   Hematological: Negative.   Psychiatric/Behavioral: Negative.   All other systems reviewed and are negative.    Objective: Vital Signs: LMP 06/03/2013 Comment: sporadic  Physical Exam  Constitutional: She is oriented to person, place, and time. She appears well-developed and well-nourished.  Pulmonary/Chest: Effort normal.  Neurological: She is alert and oriented to person, place, and time.  Skin: Skin is warm. Capillary refill takes less than 2 seconds.  Psychiatric:  She has a normal mood and affect. Her behavior is normal. Judgment and thought content normal.  Nursing note and vitals reviewed.   Ortho Exam Bilateral knee exam shows no joint effusion. Collaterals cruciates are stable. Specialty Comments:  No specialty comments available.  Imaging: No results found.   PMFS History: Patient Active Problem List   Diagnosis Date Noted  . Ovarian cyst 03/11/2014  . Trichomonas vaginalis (TV) infection 07/18/2013  . Constipation due to slow transit 07/18/2013  . S/P hysterectomy 07/14/2013  . Breast lump on left side at 2 o'clock position 11/25/2012  . Breast lump on right side at 2 o'clock position 11/25/2012  . Disturbance of skin sensation 09/10/2012  . Anemia 08/30/2012  . Genital herpes 08/28/2012  . Right arm pain 08/08/2012  . Biceps tendinitis on right 08/08/2012   Past Medical History:  Diagnosis Date  . Anemia   . Genital herpes   . TIA (transient ischemic attack)    "light stroke" age 16 or 48yrs    Family History  Problem Relation Age of Onset  . Hypertension Mother        Living, 86  . Thyroid disease Mother   . Healthy Brother        Living, 60  . Breast cancer Maternal Grandmother        Died, 68  . Heart disease Maternal Grandmother     Past  Surgical History:  Procedure Laterality Date  . ABDOMINAL HYSTERECTOMY    . LYSIS OF ADHESION N/A 07/14/2013   Procedure: EXTENSIVE LYSIS OF ADHESIONS, ENTERO LYSIS OF ADHESIONS;  Surgeon: Osborne Oman, MD;  Location: [facility];  Service: Gynecology;  Laterality: N/A;  . MYOMECTOMY  2005  . MYOMECTOMY N/A 07/14/2013   Procedure: MYOMECTOMY;  Surgeon: Osborne Oman, MD;  Location: [facility];  Service: Gynecology;  Laterality: N/A;  . SALPINGOOPHORECTOMY Left 07/14/2013   Procedure: SALPINGO OOPHORECTOMY;  Surgeon: Osborne Oman, MD;  Location: [facility];  Service: Gynecology;  Laterality: Left;  . SUPRACERVICAL ABDOMINAL HYSTERECTOMY N/A 07/14/2013   Procedure: HYSTERECTOMY  SUPRACERVICAL ABDOMINAL;  Surgeon: Osborne Oman, MD;  Location: [facility];  Service: Gynecology;  Laterality: N/A;   Social History   Occupational History  . Not on file.   Social History Main Topics  . Smoking status: Never Smoker  . Smokeless tobacco: Never Used  . Alcohol use Yes     Comment: social use  . Drug use: No  . Sexual activity: No

## 2016-10-18 NOTE — Telephone Encounter (Signed)
SW Franklin at [facility] imaging and she will contact pt to schedule appt

## 2016-10-23 ENCOUNTER — Ambulatory Visit (INDEPENDENT_AMBULATORY_CARE_PROVIDER_SITE_OTHER): Payer: BLUE CROSS/BLUE SHIELD | Admitting: Orthopaedic Surgery

## 2016-10-29 ENCOUNTER — Ambulatory Visit
Admission: RE | Admit: 2016-10-29 | Discharge: 2016-10-29 | Disposition: A | Discharge status: Home / Self Care | Payer: BLUE CROSS/BLUE SHIELD | Source: Ambulatory Visit | Attending: Orthopaedic Surgery | Admitting: Orthopaedic Surgery

## 2016-10-29 DIAGNOSIS — M25561 Pain in right knee: Principal | ICD-10-CM

## 2016-10-29 DIAGNOSIS — G8929 Other chronic pain: Secondary | ICD-10-CM

## 2016-10-29 DIAGNOSIS — M25562 Pain in left knee: Principal | ICD-10-CM

## 2016-11-05 ENCOUNTER — Ambulatory Visit (INDEPENDENT_AMBULATORY_CARE_PROVIDER_SITE_OTHER): Payer: BLUE CROSS/BLUE SHIELD | Admitting: Orthopaedic Surgery

## 2016-11-05 ENCOUNTER — Encounter (INDEPENDENT_AMBULATORY_CARE_PROVIDER_SITE_OTHER): Payer: Self-pay | Admitting: Orthopaedic Surgery

## 2016-11-05 DIAGNOSIS — M1712 Unilateral primary osteoarthritis, left knee: Secondary | ICD-10-CM

## 2016-11-05 DIAGNOSIS — M1711 Unilateral primary osteoarthritis, right knee: Secondary | ICD-10-CM

## 2016-11-05 MED ORDER — MELOXICAM 7.5 MG PO TABS: 7.5000 mg | ORAL_TABLET | Freq: Two times a day (BID) | ORAL | 2 refills | Status: DC | PRN

## 2016-11-05 MED ORDER — DICLOFENAC SODIUM 1 % TD GEL: 2.0000 g | Freq: Four times a day (QID) | TRANSDERMAL | 5 refills | Status: DC

## 2016-11-05 NOTE — Progress Notes (Signed)
Office Visit Note   Patient: Mary Armstrong           Date of Birth: [birth year]           MRN: [id number] Visit Date: 11/05/2016              Requested by: No referring provider defined for this encounter. PCP: Patient, No Pcp Per   Assessment & Plan: Visit Diagnoses:  1. Unilateral primary osteoarthritis, right knee   2. Unilateral primary osteoarthritis, left knee     Plan: MRI findings are consistent with mild to moderate arthritis of bilateral knees.  Prescription for meloxicam and Voltaren gel.  We briefly discussed cortisone injections.  Questions encouraged and answered.  Follow-up as needed.  Follow-Up Instructions: Return if symptoms worsen or fail to improve.   Orders:  No orders of the defined types were placed in this encounter.  Meds ordered this encounter  Medications  . diclofenac sodium (VOLTAREN) 1 % GEL    Sig: Apply 2 g topically 4 (four) times daily.    Dispense:  1 Tube    Refill:  5  . meloxicam (MOBIC) 7.5 MG tablet    Sig: Take 1 tablet (7.5 mg total) by mouth 2 (two) times daily as needed for pain.    Dispense:  30 tablet    Refill:  2      Procedures: No procedures performed   Clinical Data: No additional findings.   Subjective: Chief Complaint  Patient presents with  . Left Knee - Pain  . Right Knee - Pain    Patient is here today to review her MRI of her bilateral knees.  She denies any changes in medical history.    Review of Systems   Objective: Vital Signs: LMP 06/03/2013 Comment: sporadic  Physical Exam  Ortho Exam Bilateral knee exam is stable. Specialty Comments:  No specialty comments available.  Imaging: No results found.   PMFS History: Patient Active Problem List   Diagnosis Date Noted  . Ovarian cyst 03/11/2014  . Trichomonas vaginalis (TV) infection 07/18/2013  . Constipation due to slow transit 07/18/2013  . S/P hysterectomy 07/14/2013  . Breast lump on left side at 2 o'clock position  11/25/2012  . Breast lump on right side at 2 o'clock position 11/25/2012  . Disturbance of skin sensation 09/10/2012  . Anemia 08/30/2012  . Genital herpes 08/28/2012  . Right arm pain 08/08/2012  . Biceps tendinitis on right 08/08/2012   Past Medical History:  Diagnosis Date  . Anemia   . Genital herpes   . TIA (transient ischemic attack)    "light stroke" age 40 or 83yrs    Family History  Problem Relation Age of Onset  . Hypertension Mother        Living, 87  . Thyroid disease Mother   . Healthy Brother        Living, 51  . Breast cancer Maternal Grandmother        Died, 68  . Heart disease Maternal Grandmother     Past Surgical History:  Procedure Laterality Date  . ABDOMINAL HYSTERECTOMY    . LYSIS OF ADHESION N/A 07/14/2013   Procedure: EXTENSIVE LYSIS OF ADHESIONS, ENTERO LYSIS OF ADHESIONS;  Surgeon: Osborne Oman, MD;  Location: [facility];  Service: Gynecology;  Laterality: N/A;  . MYOMECTOMY  2005  . MYOMECTOMY N/A 07/14/2013   Procedure: MYOMECTOMY;  Surgeon: Osborne Oman, MD;  Location: [facility];  Service: Gynecology;  Laterality: N/A;  .  SALPINGOOPHORECTOMY Left 07/14/2013   Procedure: SALPINGO OOPHORECTOMY;  Surgeon: Osborne Oman, MD;  Location: [facility];  Service: Gynecology;  Laterality: Left;  . SUPRACERVICAL ABDOMINAL HYSTERECTOMY N/A 07/14/2013   Procedure: HYSTERECTOMY SUPRACERVICAL ABDOMINAL;  Surgeon: Osborne Oman, MD;  Location: [facility];  Service: Gynecology;  Laterality: N/A;   Social History   Occupational History  . Not on file.   Social History Main Topics  . Smoking status: Never Smoker  . Smokeless tobacco: Never Used  . Alcohol use Yes     Comment: social use  . Drug use: No  . Sexual activity: No

## 2016-12-21 ENCOUNTER — Encounter (HOSPITAL_COMMUNITY): Payer: Self-pay

## 2018-05-07 ENCOUNTER — Other Ambulatory Visit: Payer: Self-pay

## 2018-05-07 ENCOUNTER — Emergency Department (HOSPITAL_BASED_OUTPATIENT_CLINIC_OR_DEPARTMENT_OTHER)
Admission: EM | Admit: 2018-05-07 | Discharge: 2018-05-07 | Disposition: A | Discharge status: Home / Self Care | Payer: BLUE CROSS/BLUE SHIELD | Attending: Emergency Medicine | Admitting: Emergency Medicine

## 2018-05-07 ENCOUNTER — Encounter (HOSPITAL_BASED_OUTPATIENT_CLINIC_OR_DEPARTMENT_OTHER): Payer: Self-pay

## 2018-05-07 DIAGNOSIS — M62838 Other muscle spasm: Secondary | ICD-10-CM | POA: Diagnosis not present

## 2018-05-07 DIAGNOSIS — M546 Pain in thoracic spine: Secondary | ICD-10-CM | POA: Diagnosis present

## 2018-05-07 MED ORDER — IBUPROFEN 600 MG PO TABS: 600.0000 mg | ORAL_TABLET | Freq: Four times a day (QID) | ORAL | 0 refills | Status: AC | PRN

## 2018-05-07 MED ORDER — METHOCARBAMOL 500 MG PO TABS: 500.0000 mg | ORAL_TABLET | Freq: Two times a day (BID) | ORAL | 0 refills | Status: AC

## 2018-05-07 MED ORDER — DIAZEPAM 5 MG PO TABS
5.0000 mg | ORAL_TABLET | Freq: Once | ORAL | Status: AC
  Administered 2018-05-07: 10:00:00 5 mg via ORAL
  Filled 2018-05-07: qty 1

## 2018-05-07 MED ORDER — NAPROXEN 250 MG PO TABS
500.0000 mg | ORAL_TABLET | Freq: Once | ORAL | Status: AC
  Administered 2018-05-07: 500 mg via ORAL
  Filled 2018-05-07: qty 2

## 2018-05-07 MED ORDER — LIDOCAINE 4 % EX PTCH: 1.0000 | MEDICATED_PATCH | Freq: Two times a day (BID) | CUTANEOUS | 0 refills | Status: AC

## 2018-05-07 NOTE — Discharge Instructions (Signed)
We saw in the ER for what appears to be musculoskeletal pain.  Please take the muscle relaxants, use warm compresses and perform stretching exercises that we discussed.  Additionally, deep tissue massage can further help alleviate the pain.  The muscle relaxants will make you drowsy, therefore we recommend that you do not drive after using it.  Return to the ER immediately if you start having difficulty in breathing or worsening of your symptoms.

## 2018-05-07 NOTE — ED Notes (Signed)
Pt requests to remain in wheelchair while awaiting provider due to discomfort.

## 2018-05-07 NOTE — ED Triage Notes (Signed)
Pt states back pain today while at work, 2 hours ago, took ibuprofen and muscle relaxer with no rellief.  Denies injury.

## 2018-05-07 NOTE — ED Provider Notes (Signed)
[facility] EMERGENCY DEPARTMENT Provider Note   CSN: [id number] Arrival date & time: 05/07/18  1062    History   Chief Complaint Chief Complaint  Patient presents with  . Back Pain    HPI Mary Armstrong is a 48 y.o. female.     HPI  48 year old female comes in a chief complaint of back pain. Patient reports that while at work he started having sudden onset of left-sided back pain.  Patient had just sat down, and was getting up when the pain started.  The pain is described as sharp pain that is located in the middle the back between the scapular region all the way down to the flank region.  Patient has no chest pain, abdominal pain.  Patient's pain is worse with movement.  She denies any history of similar pain in the past.  Patient denies any history of kidney stones, burning with urination, blood in the urine, cough, difficulty in breathing.  She has history of total abdominal hysterectomy.  Past Medical History:  Diagnosis Date  . Anemia   . Genital herpes   . TIA (transient ischemic attack)    "light stroke" age 78 or 76yrs    Patient Active Problem List   Diagnosis Date Noted  . Ovarian cyst 03/11/2014  . Trichomonas vaginalis (TV) infection 07/18/2013  . Constipation due to slow transit 07/18/2013  . S/P hysterectomy 07/14/2013  . Breast lump on left side at 2 o'clock position 11/25/2012  . Breast lump on right side at 2 o'clock position 11/25/2012  . Disturbance of skin sensation 09/10/2012  . Anemia 08/30/2012  . Genital herpes 08/28/2012  . Right arm pain 08/08/2012  . Biceps tendinitis on right 08/08/2012    Past Surgical History:  Procedure Laterality Date  . ABDOMINAL HYSTERECTOMY    . LYSIS OF ADHESION N/A 07/14/2013   Procedure: EXTENSIVE LYSIS OF ADHESIONS, ENTERO LYSIS OF ADHESIONS;  Surgeon: Osborne Oman, MD;  Location: [facility];  Service: Gynecology;  Laterality: N/A;  . MYOMECTOMY  2005  . MYOMECTOMY N/A 07/14/2013   Procedure:  MYOMECTOMY;  Surgeon: Osborne Oman, MD;  Location: [facility];  Service: Gynecology;  Laterality: N/A;  . SALPINGOOPHORECTOMY Left 07/14/2013   Procedure: SALPINGO OOPHORECTOMY;  Surgeon: Osborne Oman, MD;  Location: [facility];  Service: Gynecology;  Laterality: Left;  . SUPRACERVICAL ABDOMINAL HYSTERECTOMY N/A 07/14/2013   Procedure: HYSTERECTOMY SUPRACERVICAL ABDOMINAL;  Surgeon: Osborne Oman, MD;  Location: [facility];  Service: Gynecology;  Laterality: N/A;     OB History    Gravida  5   Para  0   Term      Preterm      AB  5   Living  0     SAB  1   TAB  3   Ectopic  1   Multiple      Live Births               Home Medications    Prior to Admission medications   Medication Sig Start Date End Date Taking? Authorizing Provider  gabapentin (NEURONTIN) 300 MG capsule Take 1 capsule (300 mg total) by mouth 2 (two) times daily as needed. 07/20/16   Suzan Slick, NP  ibuprofen (ADVIL) 600 MG tablet Take 1 tablet (600 mg total) by mouth every 6 (six) hours as needed. 05/07/18   Varney Biles, MD  Lidocaine 4 % PTCH Apply 1 patch topically 2 (two) times daily. 05/07/18  Varney Biles, MD  methocarbamol (ROBAXIN) 500 MG tablet Take 1 tablet (500 mg total) by mouth 2 (two) times daily. 05/07/18   Varney Biles, MD  oxyCODONE-acetaminophen (PERCOCET/ROXICET) 5-325 MG tablet Take 1-2 tablets by mouth every 6 (six) hours as needed for severe pain. Patient not taking: Reported on 05/08/2016 07/12/15   Lorayne Bender, PA-C    Family History Family History  Problem Relation Age of Onset  . Hypertension Mother        Living, 13  . Thyroid disease Mother   . Healthy Brother        Living, 58  . Breast cancer Maternal Grandmother        Died, 68  . Heart disease Maternal Grandmother     Social History Social History   Tobacco Use  . Smoking status: Never Smoker  . Smokeless tobacco: Never Used  Substance Use Topics  . Alcohol use: Yes    Comment: social use  . Drug  use: No     Allergies   Patient has no known allergies.   Review of Systems Review of Systems  Constitutional: Positive for activity change.  Respiratory: Negative for shortness of breath.   Cardiovascular: Negative for chest pain.  Gastrointestinal: Negative for abdominal distention, abdominal pain, diarrhea, nausea and vomiting.  Genitourinary: Positive for flank pain. Negative for dysuria.  Musculoskeletal: Positive for back pain.     Physical Exam Updated Vital Signs BP 129/80 (BP Location: Right Arm)   Pulse 74   Temp 98.3 F (36.8 C) (Oral)   Resp 16   Ht 5\' 2"  (1.575 m)   Wt 59 kg   LMP 06/03/2013 Comment: sporadic  SpO2 100%   BMI 23.78 kg/m   Physical Exam Vitals signs and nursing note reviewed.  Constitutional:      Appearance: She is well-developed.  HENT:     Head: Normocephalic and atraumatic.  Neck:     Musculoskeletal: Normal range of motion and neck supple.  Cardiovascular:     Rate and Rhythm: Normal rate.  Pulmonary:     Effort: Pulmonary effort is normal. No respiratory distress.     Breath sounds: No wheezing.  Abdominal:     General: Bowel sounds are normal.     Tenderness: There is no abdominal tenderness.  Musculoskeletal:     Comments: Patient has reproducible tenderness with left-sided upper extremity abduction and forward flexion.  She also has worsening of tenderness with any any activity of the upper extremity that involves resistance.  There is no rash appreciated over the back.  Patient has palpable nodules in the subscapular region, and her tenderness is worse with manipulation on them.  No focal tenderness over the spine  Skin:    General: Skin is warm and dry.     Findings: No rash.  Neurological:     Mental Status: She is alert and oriented to person, place, and time.      ED Treatments / Results  Labs (all labs ordered are listed, but only abnormal results are displayed) Labs Reviewed - No data to display  EKG  None  Radiology No results found.  Procedures Procedures (including critical care time)  Medications Ordered in ED Medications  naproxen (NAPROSYN) tablet 500 mg (500 mg Oral Given 05/07/18 1022)  diazepam (VALIUM) tablet 5 mg (5 mg Oral Given 05/07/18 1022)     Initial Impression / Assessment and Plan / ED Course  I have reviewed the triage vital signs and the nursing  notes.  Pertinent labs & imaging results that were available during my care of the patient were reviewed by me and considered in my medical decision making (see chart for details).       48 year old female comes in a chief complaint of sudden onset left back pain. She has no focal tenderness over the thoracic or lumbar spine, her pain is lateral to the midline.  The pain is reproducible with palpation of what appears to be muscle spasm.  The abdominal exam is completely benign.  There are no GU symptoms.  Patient is status post total abdominal hysterectomy.  She has no chest pain or shortness of breath  Suspicion for the sudden onset pain is thought to be musculoskeletal in nature. No imaging or lab work-up indicated at this time.  We will start her on some muscle relaxant.  We went over stretching exercises that patient will be performing at home.  Unfortunately she does not have a primary care doctor but she wants to be followed up in case she is not getting better.  We will give her contact information of Dr. Barbaraann Barthel, was a sports medicine doctor and who can send patient to physical therapy if needed.  Final Clinical Impressions(s) / ED Diagnoses   Final diagnoses:  Muscle spasm    ED Discharge Orders         Ordered    ibuprofen (ADVIL) 600 MG tablet  Every 6 hours PRN     05/07/18 1053    methocarbamol (ROBAXIN) 500 MG tablet  2 times daily     05/07/18 1053    Lidocaine 4 % PTCH  2 times daily     05/07/18 [place], Thereasa Iannello, MD 05/07/18 1114

## 2019-10-07 ENCOUNTER — Ambulatory Visit: Payer: BLUE CROSS/BLUE SHIELD | Admitting: Nurse Practitioner

## 2021-07-03 ENCOUNTER — Other Ambulatory Visit: Payer: Self-pay | Admitting: Family Medicine

## 2021-07-03 ENCOUNTER — Ambulatory Visit: Payer: Self-pay

## 2021-07-03 DIAGNOSIS — M542 Cervicalgia: Secondary | ICD-10-CM

## 2022-11-11 ENCOUNTER — Emergency Department (HOSPITAL_BASED_OUTPATIENT_CLINIC_OR_DEPARTMENT_OTHER)
Admission: EM | Admit: 2022-11-11 | Discharge: 2022-11-11 | Disposition: A | Discharge status: Home / Self Care | Payer: BC Managed Care – PPO | Attending: Emergency Medicine | Admitting: Emergency Medicine

## 2022-11-11 ENCOUNTER — Encounter (HOSPITAL_BASED_OUTPATIENT_CLINIC_OR_DEPARTMENT_OTHER): Payer: Self-pay | Admitting: Emergency Medicine

## 2022-11-11 ENCOUNTER — Other Ambulatory Visit: Payer: Self-pay

## 2022-11-11 DIAGNOSIS — M5412 Radiculopathy, cervical region: Secondary | ICD-10-CM | POA: Diagnosis not present

## 2022-11-11 DIAGNOSIS — M546 Pain in thoracic spine: Secondary | ICD-10-CM | POA: Diagnosis not present

## 2022-11-11 DIAGNOSIS — M549 Dorsalgia, unspecified: Secondary | ICD-10-CM | POA: Diagnosis present

## 2022-11-11 DIAGNOSIS — G8929 Other chronic pain: Secondary | ICD-10-CM | POA: Insufficient documentation

## 2022-11-11 MED ORDER — METHYLPREDNISOLONE 4 MG PO TBPK: ORAL_TABLET | ORAL | 0 refills | Status: AC

## 2022-11-11 MED ORDER — OXYCODONE HCL 5 MG PO TABS
10.0000 mg | ORAL_TABLET | Freq: Once | ORAL | Status: AC
  Administered 2022-11-11: 10 mg via ORAL
  Filled 2022-11-11: qty 2

## 2022-11-11 MED ORDER — IBUPROFEN 600 MG PO TABS: 600.0000 mg | ORAL_TABLET | Freq: Four times a day (QID) | ORAL | 0 refills | Status: AC | PRN

## 2022-11-11 MED ORDER — OXYCODONE HCL 5 MG PO TABS: 5.0000 mg | ORAL_TABLET | Freq: Three times a day (TID) | ORAL | 0 refills | Status: AC | PRN

## 2022-11-11 NOTE — ED Triage Notes (Signed)
Pt states she is having back pain due to back injury at work in March.  Pt had PT until one month ago.  Pt states severe back pain since Thursday, upper and lower.  Pt states she has some tingling in her leg and arm.  No known new injury.  Pt states she has a hard time sitting and laying down.  Pt taking medications and other treatments at home, but is not helping.

## 2022-11-11 NOTE — ED Provider Notes (Signed)
[facility] AT [facility] Provider Note   CSN: [id number] Arrival date & time: 11/11/22  1011     History  Chief Complaint  Patient presents with   Back Pain    Mary Armstrong is a 52 y.o. female with history of chronic back pain presenting to ED complaining of back pain.  Patient reports that she has had workman comp's issues and back pain particularly in her neck rating down into her arm for 1 to 2 years.  She goes to physical therapy for this and also sees Dr. Dimas Chyle orthopedics for this.  She said that 3 days ago she abruptly began having worsening pain in her neck, shooting and jolts up and down her back, and also down her right arm.  And sometimes down her right leg.  She denies any recent injuries.  She is says she has been trying Robaxin, Flexeril, Advil, and Tylenol at home, and has not had any relief of these medications.  She has not been able to sleep or get comfortable.   HPI     Home Medications Prior to Admission medications   Medication Sig Start Date End Date Taking? Authorizing Provider  ibuprofen (ADVIL) 600 MG tablet Take 1 tablet (600 mg total) by mouth every 6 (six) hours as needed for up to 30 doses for mild pain (pain score 1-3) or moderate pain (pain score 4-6). 11/11/22  Yes Terald Sleeper, MD  methylPREDNISolone (MEDROL DOSEPAK) 4 MG TBPK tablet Use as directed on package 11/11/22  Yes Macaiah Mangal, Kermit Balo, MD  oxyCODONE (ROXICODONE) 5 MG immediate release tablet Take 1 tablet (5 mg total) by mouth every 8 (eight) hours as needed for up to 15 doses for severe pain (pain score 7-10). 11/11/22  Yes Thania Woodlief, Kermit Balo, MD  gabapentin (NEURONTIN) 300 MG capsule Take 1 capsule (300 mg total) by mouth 2 (two) times daily as needed. 07/20/16   Adonis Huguenin, NP  ibuprofen (ADVIL) 600 MG tablet Take 1 tablet (600 mg total) by mouth every 6 (six) hours as needed. 05/07/18   Derwood Kaplan, MD  Lidocaine 4 % PTCH Apply 1 patch topically 2  (two) times daily. 05/07/18   Derwood Kaplan, MD  methocarbamol (ROBAXIN) 500 MG tablet Take 1 tablet (500 mg total) by mouth 2 (two) times daily. 05/07/18   Derwood Kaplan, MD  oxyCODONE-acetaminophen (PERCOCET/ROXICET) 5-325 MG tablet Take 1-2 tablets by mouth every 6 (six) hours as needed for severe pain. Patient not taking: Reported on 05/08/2016 07/12/15   Anselm Pancoast, PA-C      Allergies    Patient has no known allergies.    Review of Systems   Review of Systems  Physical Exam Updated Vital Signs BP 137/83   Pulse 65   Temp 97.9 F (36.6 C) (Oral)   Resp 16   Ht 5\' 2"  (1.575 m)   Wt 64.4 kg   LMP 06/03/2013 Comment: sporadic  SpO2 100%   BMI 25.97 kg/m  Physical Exam Constitutional:      General: She is not in acute distress.    Comments: Pacing the room  HENT:     Head: Normocephalic and atraumatic.  Eyes:     Conjunctiva/sclera: Conjunctivae normal.     Pupils: Pupils are equal, round, and reactive to light.  Cardiovascular:     Rate and Rhythm: Normal rate and regular rhythm.  Pulmonary:     Effort: Pulmonary effort is normal. No respiratory distress.  Musculoskeletal:  Comments: Significant bilateral trigger point deltoid tenderness Visible scoliosis of the spine  Skin:    General: Skin is warm and dry.  Neurological:     General: No focal deficit present.     Mental Status: She is alert and oriented to person, place, and time. Mental status is at baseline.     Cranial Nerves: No cranial nerve deficit.     Sensory: No sensory deficit.     Motor: No weakness.  Psychiatric:        Mood and Affect: Mood normal.        Behavior: Behavior normal.     ED Results / Procedures / Treatments   Labs (all labs ordered are listed, but only abnormal results are displayed) Labs Reviewed - No data to display  EKG None  Radiology No results found.  Procedures Procedures    Medications Ordered in ED Medications  oxyCODONE (Oxy IR/ROXICODONE) immediate  release tablet 10 mg (10 mg Oral Given 11/11/22 1136)    ED Course/ Medical Decision Making/ A&P                                 Medical Decision Making Risk Prescription drug management.   Patient is presenting to ED with acute on chronic back pain, suspected cervical radiculopathy, which she reports is a known issue and for which she sees PT and OT.  However I suspect there is likely some worsening of her back pain as she has now having intermittent jolts upper back, which is likely both nerve and muscular related.  I do not see red flags for severe nerve root impingement or cauda equina syndrome requiring emergent MRI at this time.  There is no traumatic mechanism to suspect a fracture or to require x-ray or CT imaging at this time.  I think this is a matter of pain control in the short-term, then she will need to see a spine specialist again in the office.  She has ready exhausted options of NSAIDs, Flexeril.  We will try a short course of narcotic medicine to help her get some rest and sleep, although the patient is aware this is not a long-term solution, and both the patient and her husband are aware of concerns for long-term narcotic use.  I also think a course of steroids may be beneficial.  They verbalized understanding and agreement.  They will contact their orthopedic office.        Final Clinical Impression(s) / ED Diagnoses Final diagnoses:  Chronic bilateral thoracic back pain  Cervical radiculopathy    Rx / DC Orders ED Discharge Orders          Ordered    oxyCODONE (ROXICODONE) 5 MG immediate release tablet  Every 8 hours PRN        11/11/22 1137    ibuprofen (ADVIL) 600 MG tablet  Every 6 hours PRN        11/11/22 1137    methylPREDNISolone (MEDROL DOSEPAK) 4 MG TBPK tablet        11/11/22 1137              Terald Sleeper, MD 11/11/22 1137

## 2022-11-11 NOTE — ED Notes (Signed)

## 2022-11-11 NOTE — Discharge Instructions (Signed)
You are having pain in your back that is likely coming from your spine.  This is likely combination of nerve pain as well as muscle spasms or pain.  I amended continue taking Tylenol, ibuprofen, and muscle relaxers as needed.  You can also use heating packs and muscle rubs over-the-counter.  For more severe breakthrough pain, I prescribed you oxycodone which is an opioid narcotic.  Please be aware that long-term use of opioid narcotics has been linked to addiction and dependency problems.  Please aware also that some opioids can cause constipation in some patients.  Consider buying a stool softener using this as needed while taking oxycodone.  Finally I prescribed you a course of steroids which is called a Medrol Dosepak.  These can help with inflammation in the nerves.  Begin taking these today and take them in the morning as instructed on the package to finish the course.
